# Patient Record
Sex: Male | Born: 1950 | Race: Black or African American | Hispanic: No | Marital: Married | Smoking: Never smoker
Health system: Southern US, Community
[De-identification: ages and names within clinical notes are randomized; demographics above are authoritative.]

## PROBLEM LIST (undated history)

## (undated) DIAGNOSIS — M169 Osteoarthritis of hip, unspecified: Secondary | ICD-10-CM

## (undated) DIAGNOSIS — I739 Peripheral vascular disease, unspecified: Secondary | ICD-10-CM

## (undated) DIAGNOSIS — M170 Bilateral primary osteoarthritis of knee: Secondary | ICD-10-CM

## (undated) DIAGNOSIS — I82409 Acute embolism and thrombosis of unspecified deep veins of unspecified lower extremity: Secondary | ICD-10-CM

## (undated) DIAGNOSIS — T8859XA Other complications of anesthesia, initial encounter: Secondary | ICD-10-CM

## (undated) DIAGNOSIS — I10 Essential (primary) hypertension: Secondary | ICD-10-CM

## (undated) DIAGNOSIS — E291 Testicular hypofunction: Secondary | ICD-10-CM

## (undated) DIAGNOSIS — R7303 Prediabetes: Secondary | ICD-10-CM

## (undated) HISTORY — DX: Bilateral primary osteoarthritis of knee: M17.0

## (undated) HISTORY — DX: Essential (primary) hypertension: I10

## (undated) HISTORY — DX: Osteoarthritis of hip, unspecified: M16.9

## (undated) HISTORY — PX: NECK SURGERY: SHX720

## (undated) HISTORY — PX: HEMORRHOIDECTOMY WITH HEMORRHOID BANDING: SHX5633

## (undated) HISTORY — DX: Testicular hypofunction: E29.1

## (undated) HISTORY — PX: KNEE ARTHROSCOPY: SUR90

---

## 1998-01-23 ENCOUNTER — Other Ambulatory Visit: Admission: RE | Admit: 1998-01-23 | Discharge: 1998-01-23 | Payer: Self-pay | Admitting: Internal Medicine

## 2000-01-20 ENCOUNTER — Ambulatory Visit (HOSPITAL_COMMUNITY): Admission: RE | Admit: 2000-01-20 | Discharge: 2000-01-20 | Payer: Self-pay | Admitting: Internal Medicine

## 2003-08-19 ENCOUNTER — Inpatient Hospital Stay (HOSPITAL_COMMUNITY): Admission: EM | Admit: 2003-08-19 | Discharge: 2003-08-20 | Payer: Self-pay | Admitting: Emergency Medicine

## 2003-09-03 ENCOUNTER — Ambulatory Visit (HOSPITAL_COMMUNITY): Admission: RE | Admit: 2003-09-03 | Discharge: 2003-09-03 | Payer: Self-pay | Admitting: General Surgery

## 2004-07-13 ENCOUNTER — Ambulatory Visit (HOSPITAL_COMMUNITY): Admission: RE | Admit: 2004-07-13 | Discharge: 2004-07-13 | Payer: Self-pay | Admitting: Internal Medicine

## 2004-09-27 HISTORY — PX: CARDIAC CATHETERIZATION: SHX172

## 2007-09-28 HISTORY — PX: CARPAL TUNNEL RELEASE: SHX101

## 2007-12-26 ENCOUNTER — Ambulatory Visit (HOSPITAL_COMMUNITY): Admission: RE | Admit: 2007-12-26 | Discharge: 2007-12-26 | Payer: Self-pay | Admitting: Internal Medicine

## 2008-01-26 ENCOUNTER — Ambulatory Visit (HOSPITAL_BASED_OUTPATIENT_CLINIC_OR_DEPARTMENT_OTHER): Admission: RE | Admit: 2008-01-26 | Discharge: 2008-01-26 | Payer: Self-pay | Admitting: Urology

## 2008-10-30 HISTORY — PX: CARDIAC CATHETERIZATION: SHX172

## 2011-01-15 ENCOUNTER — Other Ambulatory Visit: Payer: Self-pay | Admitting: Internal Medicine

## 2011-01-15 DIAGNOSIS — E230 Hypopituitarism: Secondary | ICD-10-CM

## 2011-01-19 ENCOUNTER — Ambulatory Visit (HOSPITAL_COMMUNITY)
Admission: RE | Admit: 2011-01-19 | Discharge: 2011-01-19 | Disposition: A | Discharge status: Home / Self Care | Payer: BC Managed Care – PPO | Source: Ambulatory Visit | Attending: Internal Medicine | Admitting: Internal Medicine

## 2011-01-19 DIAGNOSIS — J3489 Other specified disorders of nose and nasal sinuses: Secondary | ICD-10-CM | POA: Insufficient documentation

## 2011-01-19 DIAGNOSIS — M5090 Cervical disc disorder, unspecified, unspecified cervical region: Secondary | ICD-10-CM | POA: Insufficient documentation

## 2011-01-19 DIAGNOSIS — E349 Endocrine disorder, unspecified: Secondary | ICD-10-CM | POA: Insufficient documentation

## 2011-01-19 DIAGNOSIS — M5030 Other cervical disc degeneration, unspecified cervical region: Secondary | ICD-10-CM | POA: Insufficient documentation

## 2011-01-19 DIAGNOSIS — E230 Hypopituitarism: Secondary | ICD-10-CM

## 2011-01-19 MED ORDER — GADOBENATE DIMEGLUMINE 529 MG/ML IV SOLN
10.0000 mL | Freq: Once | INTRAVENOUS | Status: AC
  Administered 2011-01-19: 10 mL via INTRAVENOUS

## 2011-02-09 NOTE — Op Note (Signed)
Craig [name], Craig [name]                   ACCOUNT NO.:  [id number]   MEDICAL RECORD NO.:  [id number]          PATIENT TYPE:  AMB   LOCATION:  NESC                         FACILITY:  [facility]   PHYSICIAN:  Sigmund I. Patsi Sears, M.D.DATE OF BIRTH:  1950/11/27   DATE OF PROCEDURE:  01/26/2008  DATE OF DISCHARGE:                               OPERATIVE REPORT   PREOPERATIVE DIAGNOSES:  1. Hematuria.  2. Urethral stricture.   POSTOPERATIVE DIAGNOSES:  1. Hematuria.  2. Urethral stricture.   PROCEDURE PERFORMED:  Cystourethroscopy, balloon dilation of urethra,  Foley catheter placement.   DRAINS:  20-French council tipped catheter.   INDICATIONS FOR PROCEDURE:  Craig [name] is a 60 year old male who is know  to have urethral stricture.  He has had decreased caliber of stream.  He  also has had hematuria.  He was brought to the operating room for  further diagnostic and therapeutic maneuvers.   DESCRIPTION OF PROCEDURE:  The patient was brought to the operating  room.  He was identified by his arm band.  Informed consent was verified  and preoperative time out was performed.  After the successful induction  of general endotracheal anesthesia, the patient was moved to the dorsal  lithotomy position where all appropriate pressure points were padded to  avoid apraxia and compartment syndrome.  Perineum was prepped and  draped.  Surgeon was Designer, jewellery.  A 22-French cystoscopic sheath  was used to introduce the 12 degree cystoscopic lens, was introduced  into the urethra.  Anterior urethra was inspected.  The fossa  navicularis and pendulous urethra were normal.  Upon entering the bulbar  urethra, we saw a very small caliber approximately 4-French urethral  stricture.  Attempts to navigate a guidewire over this were  unsuccessful.  An end-hole catheter was then placed over the guidewire  to the level of the stricture.  We performed retrograde urethrography  which noted an approximately 2- to  3-cm dense stricture in the bulbar  urethra.  Contrast was seen to travel in retrograde fashion in the  bladder.  We then manipulated an angled Glidewire through the end-hole  catheter into the bladder.  The end-hole catheter was then advanced into  the bladder.  The Glidewire was removed.  The guidewire was replaced.  The Cook dilating balloon 24-French caliber was then placed over the  wire.  The wire was removed.  We then used a pressurized syringe to  inflate the balloon under fluoroscopy.  We noted a waist on the  stricture.  This waist was then resolved with pressure.  We then held  the balloon in place at 15 mmHg for a time of 5 minutes.  The balloon  was then taken down.  The wire was replaced.  The cystoscope was  reinserted into the bladder.  The wire was then removed.  We inspected  the bladder and urethra.  The stricture was well dilated.  The remainder  of the bladder was inspected and free of any mucosal lesions, erythema,  foreign bodies, diverticula, stones.  At this time, the wire was  replaced.  The cystoscope was removed.  A 20-French Council tipped  catheter was placed over the wire into the bladder.  Its placement was  confirmed with fluoroscopy.  We removed the wire.  Balloon was inflated  with 10 mL of sterile  water.  It was placed to straight drain.  The procedure was terminated.  The patient tolerated the procedure well, and there were no  complications.  Jethro Bolus was the attending primary responsible  physician who was present and participated in all aspects of the  procedure.      Melina Schools, MD      Sigmund I. Patsi Sears, M.D.  Electronically Signed    JR/MEDQ  D:  01/26/2008  T:  01/26/2008  Job:  [id number]
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
## 2011-11-20 ENCOUNTER — Ambulatory Visit: Payer: BC Managed Care – PPO

## 2011-11-20 ENCOUNTER — Ambulatory Visit (INDEPENDENT_AMBULATORY_CARE_PROVIDER_SITE_OTHER): Payer: BC Managed Care – PPO | Admitting: Family Medicine

## 2011-11-20 VITALS — BP 144/72 | HR 49 | Temp 98.0°F | Resp 20 | Ht 64.25 in | Wt 227.2 lb

## 2011-11-20 DIAGNOSIS — J069 Acute upper respiratory infection, unspecified: Secondary | ICD-10-CM

## 2011-11-20 DIAGNOSIS — R062 Wheezing: Secondary | ICD-10-CM

## 2011-11-20 DIAGNOSIS — J988 Other specified respiratory disorders: Secondary | ICD-10-CM

## 2011-11-20 MED ORDER — AZITHROMYCIN 250 MG PO TABS: ORAL_TABLET | ORAL | Status: AC

## 2011-11-20 MED ORDER — IPRATROPIUM-ALBUTEROL 18-103 MCG/ACT IN AERO: 2.0000 | INHALATION_SPRAY | Freq: Four times a day (QID) | RESPIRATORY_TRACT | Status: DC | PRN

## 2011-11-20 NOTE — Progress Notes (Signed)
[facility]:  Office Visit  Chief Complaint:  Chief Complaint  Patient presents with  . Cough    x 1 week chest congestion  taking mucinex this am  & [facility] cough/congestion 2 caps q4h  . Ear Fullness    x 2 days    HPI: Craig [name] is a 61 y.o. male who complains of 1 wek h/o cough, congestion, ear pressure. Tried OTC meds without relief. Denies fevers, chill. + HA, sinus congestion. + wheeze  Past Medical History  Diagnosis Date  . Hypertension   . Hypogonadism male    Past Surgical History  Procedure Date  . Knee arthroscopy   . Cardiac catheterization 2006    no blockage   History   Social History  . Marital Status: Married    Spouse Name: N/A    Number of Children: N/A  . Years of Education: N/A   Social History Main Topics  . Smoking status: Never Smoker   . Smokeless tobacco: None  . Alcohol Use: No  . Drug Use: No  . Sexually Active: None   Other Topics Concern  . None   Social History Narrative  . None   Family History  Problem Relation Age of Onset  . Diabetes Mother   . Cancer Mother   . Diabetes Father    Allergies  Allergen Reactions  . Penicillins Rash   Prior to Admission medications   Medication Sig Start Date End Date Taking? Authorizing Provider  aspirin 81 MG tablet Take 81 mg by mouth daily.   Yes Historical Provider, MD  Multiple Vitamins-Minerals (MULTIVITAMIN PO) Take by mouth.   Yes Historical Provider, MD  nebivolol (BYSTOLIC) 5 MG tablet Take 5 mg by mouth every other day.   Yes Historical Provider, MD  Testosterone (ANDROGEL) 40.5 MG/2.5GM (1.62%) GEL Place onto the skin daily.   Yes Historical Provider, MD  celecoxib (CELEBREX) 200 MG capsule Take 200 mg by mouth daily.    Historical Provider, MD     ROS: The patient denies fevers, chills, night sweats, unintentional weight loss, chest pain, palpitations, dyspnea on exertion, nausea, vomiting, abdominal pain, dysuria, hematuria, melena, numbness, weakness,  or tingling. + wheeze all the time  All other systems have been reviewed and were otherwise negative with the exception of those mentioned in the HPI and as above.    PHYSICAL EXAM: Filed Vitals:   11/20/11 1126  BP: 144/72  Pulse: 49  Temp: 98 F (36.7 C)  Resp: 20   Filed Vitals:   11/20/11 1126  Height: 5' 4.25" (1.632 m)  Weight: 227 lb 3.2 oz (103.057 kg)   Body mass index is 38.70 kg/(m^2).  General: Alert, no acute distress HEENT:  Normocephalic, atraumatic, oropharynx patent. Ext and TM normal bilaterally, boggy nares, + sinus tenderness Cardiovascular:  Regular rate and rhythm, no rubs murmurs or gallops.  No Carotid bruits, radial pulse intact. No pedal edema.  Respiratory: Clear to auscultation bilaterally, rales, or rhonchi.  No cyanosis, no use of accessory musculature. + wheeze GI: No organomegaly, abdomen is soft and non-tender, positive bowel sounds.  No masses. Skin: No rashes. Neurologic: Facial musculature symmetric. Psychiatric: Patient is appropriate throughout our interaction. Lymphatic: No cervical lymphadenopathy Musculoskeletal: Gait intact.   EKG/XRAY:   Primary read interpreted by Dr. Conley Rolls at [facility]. ? Right mid lobe infiltrate vs vascular congestion   ASSESSMENT/PLAN: Encounter Diagnoses  Name Primary?  . Wheeze Yes  . Respiratory infection  1. Z-pack for sinusitis vs ? PNA 2. Combivent for wheezing.    Jaley Yan PHUONG, DO 11/20/2011 1:28 PM

## 2013-01-22 ENCOUNTER — Other Ambulatory Visit (HOSPITAL_COMMUNITY): Payer: Self-pay | Admitting: Internal Medicine

## 2013-01-22 DIAGNOSIS — M25561 Pain in right knee: Secondary | ICD-10-CM

## 2013-01-22 DIAGNOSIS — M7989 Other specified soft tissue disorders: Secondary | ICD-10-CM

## 2013-01-23 ENCOUNTER — Encounter (HOSPITAL_COMMUNITY): Payer: Self-pay | Admitting: *Deleted

## 2013-01-23 ENCOUNTER — Emergency Department (HOSPITAL_COMMUNITY): Payer: BC Managed Care – PPO

## 2013-01-23 ENCOUNTER — Inpatient Hospital Stay (HOSPITAL_COMMUNITY)
Admission: EM | Admit: 2013-01-23 | Discharge: 2013-01-24 | DRG: 541 | Disposition: A | Discharge status: Home / Self Care | Payer: BC Managed Care – PPO | Attending: Internal Medicine | Admitting: Internal Medicine

## 2013-01-23 ENCOUNTER — Ambulatory Visit (HOSPITAL_COMMUNITY)
Admission: RE | Admit: 2013-01-23 | Discharge: 2013-01-23 | Disposition: A | Discharge status: Home / Self Care | Payer: BC Managed Care – PPO | Source: Ambulatory Visit | Attending: Internal Medicine | Admitting: Internal Medicine

## 2013-01-23 DIAGNOSIS — M7989 Other specified soft tissue disorders: Secondary | ICD-10-CM

## 2013-01-23 DIAGNOSIS — I82409 Acute embolism and thrombosis of unspecified deep veins of unspecified lower extremity: Secondary | ICD-10-CM | POA: Diagnosis present

## 2013-01-23 DIAGNOSIS — Z86711 Personal history of pulmonary embolism: Secondary | ICD-10-CM | POA: Diagnosis present

## 2013-01-23 DIAGNOSIS — M25561 Pain in right knee: Secondary | ICD-10-CM

## 2013-01-23 DIAGNOSIS — I824Z9 Acute embolism and thrombosis of unspecified deep veins of unspecified distal lower extremity: Secondary | ICD-10-CM | POA: Diagnosis present

## 2013-01-23 DIAGNOSIS — E291 Testicular hypofunction: Secondary | ICD-10-CM | POA: Diagnosis present

## 2013-01-23 DIAGNOSIS — I10 Essential (primary) hypertension: Secondary | ICD-10-CM

## 2013-01-23 DIAGNOSIS — Z7982 Long term (current) use of aspirin: Secondary | ICD-10-CM

## 2013-01-23 DIAGNOSIS — I82401 Acute embolism and thrombosis of unspecified deep veins of right lower extremity: Secondary | ICD-10-CM

## 2013-01-23 DIAGNOSIS — I2699 Other pulmonary embolism without acute cor pulmonale: Principal | ICD-10-CM | POA: Diagnosis present

## 2013-01-23 DIAGNOSIS — I517 Cardiomegaly: Secondary | ICD-10-CM | POA: Diagnosis present

## 2013-01-23 DIAGNOSIS — Z79899 Other long term (current) drug therapy: Secondary | ICD-10-CM

## 2013-01-23 LAB — CBC WITH DIFFERENTIAL/PLATELET
Basophils Absolute: 0.1 10*3/uL (ref 0.0–0.1)
Basophils Relative: 1 % (ref 0–1)
Eosinophils Absolute: 0.3 10*3/uL (ref 0.0–0.7)
Eosinophils Relative: 5 % (ref 0–5)
HCT: 42.6 % (ref 39.0–52.0)
Hemoglobin: 14.9 g/dL (ref 13.0–17.0)
Lymphocytes Relative: 22 % (ref 12–46)
Lymphs Abs: 1.2 10*3/uL (ref 0.7–4.0)
MCH: 29.6 pg (ref 26.0–34.0)
MCHC: 35 g/dL (ref 30.0–36.0)
MCV: 84.7 fL (ref 78.0–100.0)
Monocytes Absolute: 0.4 10*3/uL (ref 0.1–1.0)
Monocytes Relative: 7 % (ref 3–12)
Neutro Abs: 3.7 10*3/uL (ref 1.7–7.7)
Neutrophils Relative %: 65 % (ref 43–77)
Platelets: 138 10*3/uL — ABNORMAL LOW (ref 150–400)
RBC: 5.03 MIL/uL (ref 4.22–5.81)
RDW: 12.6 % (ref 11.5–15.5)
WBC: 5.7 10*3/uL (ref 4.0–10.5)

## 2013-01-23 LAB — BASIC METABOLIC PANEL
BUN: 11 mg/dL (ref 6–23)
CO2: 28 mEq/L (ref 19–32)
Calcium: 9.5 mg/dL (ref 8.4–10.5)
Chloride: 103 mEq/L (ref 96–112)
Creatinine, Ser: 1.04 mg/dL (ref 0.50–1.35)
GFR calc Af Amer: 86 mL/min — ABNORMAL LOW (ref >90)
GFR calc non Af Amer: 74 mL/min — ABNORMAL LOW (ref >90)
Glucose, Bld: 106 mg/dL — ABNORMAL HIGH (ref 70–99)
Potassium: 4.4 mEq/L (ref 3.5–5.1)
Sodium: 138 mEq/L (ref 135–145)

## 2013-01-23 LAB — PROTIME-INR
INR: 1.04 (ref 0.00–1.49)
Prothrombin Time: 13.5 seconds (ref 11.6–15.2)

## 2013-01-23 MED ORDER — HYDROMORPHONE HCL PF 1 MG/ML IJ SOLN: 1.0000 mg | INTRAMUSCULAR | Status: DC | PRN

## 2013-01-23 MED ORDER — RIVAROXABAN 15 MG PO TABS
15.0000 mg | ORAL_TABLET | Freq: Two times a day (BID) | ORAL | Status: DC
Start: 2013-01-23 — End: 2013-01-23
  Filled 2013-01-23: qty 1

## 2013-01-23 MED ORDER — ENOXAPARIN SODIUM 100 MG/ML ~~LOC~~ SOLN
100.0000 mg | SUBCUTANEOUS | Status: AC
  Administered 2013-01-23: 100 mg via SUBCUTANEOUS
  Filled 2013-01-23: qty 1

## 2013-01-23 MED ORDER — IOHEXOL 350 MG/ML SOLN
100.0000 mL | Freq: Once | INTRAVENOUS | Status: AC | PRN
  Administered 2013-01-23: 100 mL via INTRAVENOUS

## 2013-01-23 MED ORDER — ONDANSETRON HCL 4 MG/2ML IJ SOLN: 4.0000 mg | Freq: Three times a day (TID) | INTRAMUSCULAR | Status: AC | PRN

## 2013-01-23 MED ORDER — ENOXAPARIN SODIUM 100 MG/ML ~~LOC~~ SOLN
100.0000 mg | Freq: Two times a day (BID) | SUBCUTANEOUS | Status: DC
  Filled 2013-01-23: qty 1

## 2013-01-23 MED ORDER — ACETAMINOPHEN 650 MG RE SUPP: 650.0000 mg | Freq: Four times a day (QID) | RECTAL | Status: DC | PRN

## 2013-01-23 MED ORDER — ONDANSETRON HCL 4 MG PO TABS: 4.0000 mg | ORAL_TABLET | Freq: Four times a day (QID) | ORAL | Status: DC | PRN

## 2013-01-23 MED ORDER — ACETAMINOPHEN 325 MG PO TABS: 650.0000 mg | ORAL_TABLET | Freq: Four times a day (QID) | ORAL | Status: DC | PRN

## 2013-01-23 MED ORDER — SODIUM CHLORIDE 0.9 % IJ SOLN
3.0000 mL | Freq: Two times a day (BID) | INTRAMUSCULAR | Status: DC
  Administered 2013-01-23 – 2013-01-24 (×2): 3 mL via INTRAVENOUS

## 2013-01-23 MED ORDER — RIVAROXABAN 20 MG PO TABS: 20.0000 mg | ORAL_TABLET | Freq: Every day | ORAL | Status: DC

## 2013-01-23 MED ORDER — ONDANSETRON HCL 4 MG/2ML IJ SOLN: 4.0000 mg | Freq: Four times a day (QID) | INTRAMUSCULAR | Status: DC | PRN

## 2013-01-23 MED ORDER — RIVAROXABAN 15 MG PO TABS
15.0000 mg | ORAL_TABLET | Freq: Two times a day (BID) | ORAL | Status: DC
  Administered 2013-01-23 – 2013-01-24 (×2): 15 mg via ORAL
  Filled 2013-01-23 (×4): qty 1

## 2013-01-23 NOTE — Progress Notes (Signed)
ANTICOAGULATION CONSULT NOTE - Initial Consult  Pharmacy Consult for Lovenox Indication: DVT  Allergies  Allergen Reactions  . Penicillins Rash    Patient Measurements:   Stated : Wt ~220 lb (100kg); Ht 64 in  Vital Signs: Temp: 98.4 F (36.9 C) (04/29 0942) Temp src: Oral (04/29 0942) BP: 127/76 mmHg (04/29 0942) Pulse Rate: 54 (04/29 0942)  Labs:  Recent Labs  01/23/13 1055  HGB 14.9  HCT 42.6  PLT 138*  LABPROT 13.5  INR 1.04  CREATININE 1.04    The CrCl is unknown because both a height and weight (above a minimum accepted value) are required for this calculation.   Medical History: Past Medical History  Diagnosis Date  . Hypertension   . Hypogonadism male     Medications:  ASA 81, Celebrex, Clomid, MVI, Nevibolol  Assessment: 62 y.o. male presents with leg pain. Ultrasound in ED shows DVT in RLE. To begin lovenox. CBC okay at baseline except plt slightly low at 138.  Goal of Therapy:  Anti-Xa level 0.6-1.2 units/ml 4hrs after LMWH dose given Monitor platelets by anticoagulation protocol: Yes   Plan:  1. Lovenox 100mg  SQ q12h. First dose now. 2. Can consider Lovenox 150mg  SQ q24h if desire to send pt home 3. Also consider start of coumadin or one of newer anticoagulants (ex. rivaroxaban). Newer anticoagulants do not require Lovenox bridge.  Christoper Fabian, PharmD, BCPS Clinical pharmacist, pager [phone] 01/23/2013,12:17 PM

## 2013-01-23 NOTE — H&P (Signed)
[facility] History and Physical  SANEL STEMMER ZOX:[id number] DOB: 1950-10-19 DOA: 01/23/2013  Referring physician: ED PCP: Laurena Slimmer, MD    Chief Complaint:  Chief Complaint  Patient presents with  . Leg Pain     HPI: Craig [name] is a 62 y.o. male who presented with complaint of right leg swelling and pain for the past week. 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 He also takes clomiphene. No personal or family history of DVT/PE. No shortness of breath at rest or chest pain however the patient does report decreased exercise tolerance with SOB over the past week while playing tennis. Venous doppler confirms an extensive DVT and bilat pulmonar emboli. Denies chest pain and hemoptysis.   Review of Systems: The patient denies anorexia, fever, weight loss,, vision loss, decreased hearing, hoarseness, chest pain, syncope,  balance deficits, hemoptysis, abdominal pain, melena, hematochezia, severe indigestion/heartburn, hematuria, incontinence, genital sores, muscle weakness, suspicious skin lesions, transient blindness, difficulty walking, depression, unusual weight change, abnormal bleeding, enlarged lymph nodes, angioedema, and breast masses.    Past Medical History  Diagnosis Date  . Hypertension   . Hypogonadism male    Past Surgical History  Procedure Laterality Date  . Knee arthroscopy    . Cardiac catheterization  2006    no blockage   Social History:  reports that he has never smoked. He does not have any smokeless tobacco history on file. He reports that he does not drink alcohol or use illicit drugs.  Allergies  Allergen Reactions  . Penicillins Rash    Family History  Problem Relation Age of Onset  . Diabetes Mother   . Cancer Mother   . Diabetes Father      Prior to Admission medications   Medication Sig Start Date End Date Taking? Authorizing Provider  aspirin 81 MG tablet Take 81 mg  by mouth daily.   Yes Historical Provider, MD  celecoxib (CELEBREX) 200 MG capsule Take 200 mg by mouth daily.   Yes Historical Provider, MD  clomiPHENE (CLOMID) 50 MG tablet Take 50 mg by mouth daily.   Yes Historical Provider, MD  Multiple Vitamins-Minerals (MULTIVITAMIN PO) Take by mouth.   Yes Historical Provider, MD  nebivolol (BYSTOLIC) 5 MG tablet Take 5 mg by mouth every other day.   Yes Historical Provider, MD   Physical Exam: Filed Vitals:   01/23/13 1408 01/23/13 1413 01/23/13 1500 01/23/13 1611  BP: 126/68 126/68 135/77 134/75  Pulse: 51 53 60 56  Temp:  99.8 F (37.7 C)  98.6 F (37 C)  TempSrc:  Oral  Oral  Resp: 16 11 22 22   Height:    5\' 4"  (1.626 m)  Weight:    100.381 kg (221 lb 4.8 oz)  SpO2: 97% 97% 96% 97%     General:  axox3  Eyes: perrla, eomi   ENT: clear pharynx   Neck: no JVD  Cardiovascular: ctab   Respiratory: rrr  Abdomen: soft, nt   Skin: warm dry   Musculoskeletal: intact   Psychiatric: euthymic   Neurologic: non focal   Labs on Admission:  Basic Metabolic Panel:  Recent Labs Lab 01/23/13 1055  NA 138  K 4.4  CL 103  CO2 28  GLUCOSE 106*  BUN 11  CREATININE 1.04  CALCIUM 9.5   Liver Function Tests: No results found for this basename: AST, ALT, ALKPHOS, BILITOT, PROT, ALBUMIN,  in the last 168  hours No results found for this basename: LIPASE, AMYLASE,  in the last 168 hours No results found for this basename: AMMONIA,  in the last 168 hours CBC:  Recent Labs Lab 01/23/13 1055  WBC 5.7  NEUTROABS 3.7  HGB 14.9  HCT 42.6  MCV 84.7  PLT 138*   Cardiac Enzymes: No results found for this basename: CKTOTAL, CKMB, CKMBINDEX, TROPONINI,  in the last 168 hours  BNP (last 3 results) No results found for this basename: PROBNP,  in the last 8760 hours CBG: No results found for this basename: GLUCAP,  in the last 168 hours  Radiological Exams on Admission: Ct Angio Chest W/cm &/or Wo Cm  01/23/2013  *RADIOLOGY  REPORT*  Clinical Data: Leg pain, DVT.  CT ANGIOGRAPHY CHEST  Technique:  Multidetector CT imaging of the chest using the standard protocol during bolus administration of intravenous contrast. Multiplanar reconstructed images including MIPs were obtained and reviewed to evaluate the vascular anatomy.  Contrast: OMNIPAQUE IOHEXOL 350 MG/ML SOLN  Comparison: None.  Findings: There are bilateral pulmonary emboli involving all lobes of both lungs.  The largest extends into the left lower pole pulmonary arteries.  Moderate sized bilateral upper lobe pulmonary emboli and right lower lobe.  Small emboli in the right middle lobe.  Mild cardiomegaly.  Aorta is normal caliber. No mediastinal, hilar, or axillary adenopathy.  Visualized thyroid and chest wall soft tissues unremarkable.  Peripheral ground-glass airspace opacity noted in the right lung base laterally.  This could reflect early pulmonary infarct.  No pleural effusions.  No focal opacity on the left.  Imaging into the upper abdomen shows no acute findings.  IMPRESSION: Multiple bilateral small to moderate-sized pulmonary emboli, the largest in the lower lobes bilaterally, left greater than right.  Peripheral ground-glass density at the right lung base may reflect early infarct.  Cardiomegaly.  These results were called to Renne Crigler at the time of interpretation.   Original Report Authenticated By: Charlett Nose, M.D.       Assessment/Plan Principal Problem:   Pulmonary embolism Active Problems:   Hypertension   DVT (deep venous thrombosis)   1. PE and DVT - plan for anticoagulation with Xarelto. Monitor in hospital for a couple of days for lung infarct and to make sure there is no hemoptysis 2. HTN prior to admission looks controlled now.    Code Status: full  Family Communication: patient  Disposition Plan: home   Kenlyn Lose [facility] Pager [phone]  If 7PM-7AM, please contact night-coverage [email or web address] Password  [facility] 01/23/2013, 4:34 PM

## 2013-01-23 NOTE — Care Management Note (Signed)
    Page 1 of 1   01/24/2013     2:13:38 PM   CARE MANAGEMENT NOTE 01/24/2013  Patient:  Craig [name], Craig [name]   Account Number:  [id number]  Date Initiated:  01/23/2013  Documentation initiated by:  Letha Cape  Subjective/Objective Assessment:   dx pe  admit- lives with spouse.     Action/Plan:   Anticipated DC Date:  01/25/2013   Anticipated DC Plan:  HOME/SELF CARE      DC Planning Services  CM consult      Choice offered to / List presented to:             Status of service:  Completed, signed off Medicare Important Message given?   (If response is "NO", the following Medicare IM given date fields will be blank) Date Medicare IM given:   Date Additional Medicare IM given:    Discharge Disposition:  HOME/SELF CARE  Per UR Regulation:  Reviewed for med. necessity/level of care/duration of stay  If discussed at Long Length of Stay Meetings, dates discussed:    Comments:  01/23/13 16:50 Letha Cape RN,BSN [phone] patient lives with spouse, pta indep.  NCM will continue to follow for dc needs.  Gave patient the xarelto card with co pay for $10, informed him if he has any problems to call me.

## 2013-01-23 NOTE — Progress Notes (Signed)
Bilateral lower extremity venous duplex completed.  Right:  DVT noted in the common femoral, femoral, profunda, popliteal, posterior tibial, and peroneal veins.  No evidence of superficial thrombosis.  No Baker's cyst.  Left:  No evidence of DVT, superficial thrombosis, or Baker's cyst.

## 2013-01-23 NOTE — ED Provider Notes (Signed)
History     CSN: [id number]  Arrival date & time 01/23/13  9147   First MD Initiated Contact with Patient 01/23/13 1015      Chief Complaint  Patient presents with  . Leg Pain    (Consider location/radiation/quality/duration/timing/severity/associated sxs/prior treatment) HPI Comments: Patient presents with complaint of right leg swelling and pain for the past week. 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 He also takes testosterone. Only blood thinner is aspirin. No history of DVT/PE. No shortness of breath at rest or chest pain however the patient does report decreased exercise tolerance with SOB over the past week while playing tennis. Denies other medical complaints. Onset of symptoms gradual. Course is gradually worsening. Nothing makes symptoms better. Walking makes pain worse.  The history is provided by the patient.    Past Medical History  Diagnosis Date  . Hypertension   . Hypogonadism male     Past Surgical History  Procedure Laterality Date  . Knee arthroscopy    . Cardiac catheterization  2006    no blockage    Family History  Problem Relation Age of Onset  . Diabetes Mother   . Cancer Mother   . Diabetes Father     History  Substance Use Topics  . Smoking status: Never Smoker   . Smokeless tobacco: Not on file  . Alcohol Use: No      Review of Systems  Constitutional: Positive for fatigue (with activity). Negative for fever.  HENT: Negative for sore throat and rhinorrhea.   Eyes: Negative for redness.  Respiratory: Positive for shortness of breath (with activity). Negative for cough.   Cardiovascular: Positive for leg swelling. Negative for chest pain.  Gastrointestinal: Negative for nausea, vomiting, abdominal pain and diarrhea.  Endocrine: Positive for polyphagia.  Genitourinary: Negative for dysuria.  Musculoskeletal: Negative for myalgias.  Skin: Negative for rash.   Neurological: Negative for headaches.    Allergies  Penicillins  Home Medications   Current Outpatient Rx  Name  Route  Sig  Dispense  Refill  . aspirin 81 MG tablet   Oral   Take 81 mg by mouth daily.         . celecoxib (CELEBREX) 200 MG capsule   Oral   Take 200 mg by mouth daily.         . clomiPHENE (CLOMID) 50 MG tablet   Oral   Take 50 mg by mouth daily.         . Multiple Vitamins-Minerals (MULTIVITAMIN PO)   Oral   Take by mouth.         . nebivolol (BYSTOLIC) 5 MG tablet   Oral   Take 5 mg by mouth every other day.           BP 127/76  Pulse 54  Temp(Src) 98.4 F (36.9 C) (Oral)  Resp 18  SpO2 94%  Physical Exam  Nursing note and vitals reviewed. Constitutional: He appears well-developed and well-nourished.  HENT:  Head: Normocephalic and atraumatic.  Eyes: Conjunctivae are normal. Right eye exhibits no discharge. Left eye exhibits no discharge.  Neck: Normal range of motion. Neck supple.  Cardiovascular: Normal rate, regular rhythm and normal heart sounds.   Pulses:      Dorsalis pedis pulses are 2+ on the right side, and 2+ on the left side.       Posterior tibial pulses are 2+ on the right side, and 2+  on the left side.  Pulmonary/Chest: Effort normal and breath sounds normal.  Abdominal: Soft. There is no tenderness.  Musculoskeletal: He exhibits edema and tenderness.  2+ pitting edema of right lower extremity to knee. Mild erythema. Findings suspicious for DVT.  Neurological: He is alert.  Skin: Skin is warm and dry.  Psychiatric: He has a normal mood and affect.    ED Course  Procedures (including critical care time)  Labs Reviewed  CBC WITH DIFFERENTIAL - Abnormal; Notable for the following:    Platelets 138 (*)    All other components within normal limits  BASIC METABOLIC PANEL - Abnormal; Notable for the following:    Glucose, Bld 106 (*)    GFR calc non Af Amer 74 (*)    GFR calc Af Amer 86 (*)    All other  components within normal limits  PROTIME-INR   Ct Angio Chest W/cm &/or Wo Cm  01/23/2013  *RADIOLOGY REPORT*  Clinical Data: Leg pain, DVT.  CT ANGIOGRAPHY CHEST  Technique:  Multidetector CT imaging of the chest using the standard protocol during bolus administration of intravenous contrast. Multiplanar reconstructed images including MIPs were obtained and reviewed to evaluate the vascular anatomy.  Contrast: OMNIPAQUE IOHEXOL 350 MG/ML SOLN  Comparison: None.  Findings: There are bilateral pulmonary emboli involving all lobes of both lungs.  The largest extends into the left lower pole pulmonary arteries.  Moderate sized bilateral upper lobe pulmonary emboli and right lower lobe.  Small emboli in the right middle lobe.  Mild cardiomegaly.  Aorta is normal caliber. No mediastinal, hilar, or axillary adenopathy.  Visualized thyroid and chest wall soft tissues unremarkable.  Peripheral ground-glass airspace opacity noted in the right lung base laterally.  This could reflect early pulmonary infarct.  No pleural effusions.  No focal opacity on the left.  Imaging into the upper abdomen shows no acute findings.  IMPRESSION: Multiple bilateral small to moderate-sized pulmonary emboli, the largest in the lower lobes bilaterally, left greater than right.  Peripheral ground-glass density at the right lung base may reflect early infarct.  Cardiomegaly.  These results were called to Renne Crigler at the time of interpretation.   Original Report Authenticated By: Charlett Nose, M.D.      1. Pulmonary embolism   2. DVT (deep venous thrombosis), right     10:38 AM Patient seen and examined. Work-up initiated. +DVT. Will need CT given sx.   Vital signs reviewed and are as follows: Filed Vitals:   01/23/13 0942  BP: 127/76  Pulse: 54  Temp: 98.4 F (36.9 C)  Resp: 18   1:44 PM CT reviewed and interpreted by myself. Lovenox already given. D/w Dr. Denton Lank.   Pt admitted to hospitalist. He is stable.     MDM  PE/DVT -- admit for treatment.        Renne Crigler, PA-C 01/23/13 1546

## 2013-01-23 NOTE — ED Notes (Signed)
To ED for eval of left leg for the past cple weeks. Pt has swelling in leg and told to come to ED. No SOB. No CP. Pt is taking low dose ASA and Celebrex.

## 2013-01-23 NOTE — Progress Notes (Signed)
Craig [name] [id number] Code Status: full   Admission Data: 01/23/2013 5:35 PM Attending Provider:  laza WUJ:WJXBJ,YNWGNFA S, MD Consults/ Treatment Team:    Craig [name] is a 62 y.o. male patient admitted from ED awake, alert - oriented  X 3 - no acute distress noted.  VSS - Blood pressure 134/75, pulse 56, temperature 98.6 F (37 C), temperature source Oral, resp. rate 22, height 5\' 4"  (1.626 m), weight 100.381 kg (221 lb 4.8 oz), SpO2 97.00%.  no c/o shortness of breath, no c/o chest pain. Cardiac tele # [id number], in place, cardiac monitor yields:normal sinus rhythm. IV Fluids:  IV in place, occlusive dsg intact without redness, IV cath forearm left, condition patent and no redness none.  Allergies:   Allergies  Allergen Reactions  . Penicillins Rash     Past Medical History  Diagnosis Date  . Hypertension   . Hypogonadism male    Medications Prior to Admission  Medication Sig Dispense Refill  . aspirin 81 MG tablet Take 81 mg by mouth daily.      . celecoxib (CELEBREX) 200 MG capsule Take 200 mg by mouth daily.      . clomiPHENE (CLOMID) 50 MG tablet Take 50 mg by mouth daily.      . Multiple Vitamins-Minerals (MULTIVITAMIN PO) Take by mouth.      . nebivolol (BYSTOLIC) 5 MG tablet Take 5 mg by mouth every other day.       History:  obtained from the patient. Tobacco/alcohol: denied none  Orientation to room, and floor completed with information packet given to patient/family.  Patient declined safety video at this time.  Admission INP armband ID verified with patient/family, and in place.   SR up x 2, fall assessment complete, with patient and family able to verbalize understanding of risk associated with falls, and verbalized understanding to call nsg before up out of bed.  Call light within reach, patient able to voice, and demonstrate understanding.  Skin, clean-dry- intact without evidence of bruising, or skin tears.   No evidence of skin break down noted on exam.     Will  cont to eval and treat per MD orders.  Orvan Seen, RN 01/23/2013 5:35 PM

## 2013-01-24 DIAGNOSIS — I2699 Other pulmonary embolism without acute cor pulmonale: Secondary | ICD-10-CM

## 2013-01-24 LAB — CBC
MCV: 85.1 fL (ref 78.0–100.0)
Platelets: 144 10*3/uL — ABNORMAL LOW (ref 150–400)
RDW: 12.5 % (ref 11.5–15.5)
WBC: 5.5 10*3/uL (ref 4.0–10.5)

## 2013-01-24 LAB — BASIC METABOLIC PANEL
Calcium: 9.2 mg/dL (ref 8.4–10.5)
Creatinine, Ser: 1.03 mg/dL (ref 0.50–1.35)
GFR calc Af Amer: 87 mL/min — ABNORMAL LOW (ref >90)
Sodium: 139 mEq/L (ref 135–145)

## 2013-01-24 MED ORDER — RIVAROXABAN 20 MG PO TABS: 20.0000 mg | ORAL_TABLET | Freq: Every day | ORAL | Status: DC

## 2013-01-24 MED ORDER — RIVAROXABAN 15 MG PO TABS: 15.0000 mg | ORAL_TABLET | Freq: Two times a day (BID) | ORAL | Status: DC

## 2013-01-24 NOTE — Progress Notes (Signed)
Craig [name] to be D/C'd Home per MD order.  Discussed with the patient and all questions fully answered.    Medication List    STOP taking these medications       celecoxib 200 MG capsule  Commonly known as:  CELEBREX     clomiPHENE 50 MG tablet  Commonly known as:  CLOMID      TAKE these medications       aspirin 81 MG tablet  Take 81 mg by mouth daily.     MULTIVITAMIN PO  Take by mouth.     nebivolol 5 MG tablet  Commonly known as:  BYSTOLIC  Take 5 mg by mouth every other day.     Rivaroxaban 15 MG Tabs tablet  Commonly known as:  XARELTO  Take 1 tablet (15 mg total) by mouth 2 (two) times daily before a meal.     Rivaroxaban 20 MG Tabs  Commonly known as:  XARELTO  Take 1 tablet (20 mg total) by mouth daily with supper. Start this after 21 days   The 1st 21 days is 15 mg twice a day.  Start taking on:  02/13/2013        VVS, Skin clean, dry and intact without evidence of skin break down, no evidence of skin tears noted. IV catheter discontinued intact. Site without signs and symptoms of complications. Dressing and pressure applied.  An After Visit Summary was printed and given to the patient. Patient escorted via WC, and D/C home via private auto.  Kennyth Arnold D 01/24/2013 12:02 PM

## 2013-01-24 NOTE — ED Provider Notes (Signed)
Medical screening examination/treatment/procedure(s) were conducted as a shared visit with non-physician practitioner(s) and myself.  I personally evaluated the patient during the encounter Pt with bil leg swelling, vascular dopplers w dvt, now w sob. pe on ct.  Admit.   Suzi Roots, MD 01/24/13 [phone]

## 2013-01-24 NOTE — Discharge Summary (Signed)
Physician Discharge Summary  Craig [name] RUE:[id number] DOB: 07-Mar-1951 DOA: 01/23/2013  PCP: Laurena Slimmer, MD  Admit date: 01/23/2013 Discharge date: 01/24/2013  Time spent: 35 minutes  Recommendations for Outpatient Follow-up:  1. 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 Multiple bilateral small to moderate-sized pulmonary emboli, the largest in the lower lobes bilaterally, left greater than right. Peripheral ground-glass density at the right lung base may reflect early infarct. Cardiomegaly. With extensive right lower extremity DVT   Hospital Course:  Extensive Right DVT with Bilateral PE -Etiology likely a combination of Clomidiphene (hormone) therapy and a being a long haul truck driver. -Venous doppler shows right lower extremity JYN:WGNFA deep vein thrombosis involving the right common femoral, femoral, profunda, popliteal, posterior tibial, and peroneal veins. -CT angio shows bilateral PE -Monitored in the hospital overnight to ensure stability and monitor for signs of right heart failure. -Started on anticoagulation with Xarelto x 6 months  -Ambulatory Pulse ox test to verify oxygen saturation stability for discharge -Follow up with Dr. Margaretmary Bayley for monitoring and final determination regarding treatment for low testosterone.  HTN -Appears well controlled on bystolic   Discharge Exam: Filed Vitals:   01/23/13 1611 01/23/13 2150 01/24/13 0533 01/24/13 0930  BP: 134/75 107/62 135/83 113/73  Pulse: 56 54 50 63  Temp: 98.6 F (37  C) 98.7 F (37.1 C) 98.2 F (36.8 C) 97.9 F (36.6 C)  TempSrc: Oral Oral Oral Oral  Resp: 22 22 20 22   Height: 5\' 4"  (1.626 m)     Weight: 100.381 kg (221 lb 4.8 oz)     SpO2: 97% 97% 98% 95%    General: WDWN in no acute distress. Appears energetic and non-toxic Cardiovascular: RRR, no MGR  Respiratory: CTA B  Abdomen: soft NT, ND, Bs+  Musculoskeletal: full range of motion and strength in all for extremities. Right lower extremity has 1-2+ pitting edema and is slightly warm. Positive homans sign.   Discharge Instructions  Discharge Orders   Future Orders Complete By Expires     Diet - low sodium heart healthy  As directed     Increase activity slowly  As directed         Medication List    STOP taking these medications       celecoxib 200 MG capsule  Commonly known as:  CELEBREX     clomiPHENE 50 MG tablet  Commonly known as:  CLOMID      TAKE these medications       aspirin 81 MG tablet  Take 81 mg by mouth daily.     MULTIVITAMIN PO  Take by mouth.     nebivolol 5 MG tablet  Commonly known as:  BYSTOLIC  Take 5 mg by mouth every other day.     Rivaroxaban 15 MG Tabs tablet  Commonly known as:  XARELTO  Take 1 tablet (15 mg total) by mouth 2 (two) times daily before a meal.     Rivaroxaban 20 MG Tabs  Start taking on:  02/13/2013  Commonly known  as:  XARELTO  Take 1 tablet (20 mg total) by mouth daily with supper. Start this after 21 days   The 1st 21 days is 15 mg twice a day.             Follow-up Information   Follow up with Laurena Slimmer, MD. Schedule an appointment as soon as possible for a visit in 1 week.   Contact information:   [address] [place] [address] [phone]        The results of significant diagnostics from this hospitalization (including imaging, microbiology, ancillary and laboratory) are listed below for reference.    Significant Diagnostic  Studies:  Venous Doppler Ultrasound of the lower extremities Venous flow:  +-----------------------+----------+----------------------+ Location Overall Flow properties  +-----------------------+----------+----------------------+ Right common femoral ThrombosedNon phasic;    spontaneous;    noncompressible  +-----------------------+----------+----------------------+ Right femoral ThrombosedNot spontaneous;    noncompressible  +-----------------------+----------+----------------------+ Right profunda femoral ThrombosedNot spontaneous;    noncompressible  +-----------------------+----------+----------------------+ Right popliteal ThrombosedNot spontaneous;    noncompressible  +-----------------------+----------+----------------------+ Right posterior tibial ThrombosedNot spontaneous;    noncompressible  +-----------------------+----------+----------------------+ Right peroneal ThrombosedNot spontaneous;    noncompressible  +-----------------------+----------+----------------------+ Right saphenofemoral Patent Compressible  junction    +-----------------------+----------+----------------------+ Right greater saphenousPatent Compressible  +-----------------------+----------+----------------------+ Left common femoral Patent Phasic; spontaneous;    compressible  +-----------------------+----------+----------------------+ Left femoral Patent Compressible  +-----------------------+----------+----------------------+ Left profunda femoral Patent Compressible  +-----------------------+----------+----------------------+ Left popliteal Patent Phasic; spontaneous;    compressible  +-----------------------+----------+----------------------+ Left posterior tibial Patent Compressible  +-----------------------+----------+----------------------+ Left peroneal Patent Compressible   +-----------------------+----------+----------------------+ Left saphenofemoral Patent Compressible  junction    +-----------------------+----------+----------------------+ Left greater saphenous Patent Compressible  +-----------------------+----------+----------------------+  ------------------------------------------------------------ Summary:  - Findings consistent with acute deep vein thrombosis involving the right common femoral, femoral, profunda, popliteal, posterior tibial, and peroneal veins. - No evidence of deep vein thrombosis involving the left lower extremity.   Ct Angio Chest W/cm &/or Wo Cm  01/23/2013  *RADIOLOGY REPORT*  Clinical Data: Leg pain, DVT.  CT ANGIOGRAPHY CHEST  Technique:  Multidetector CT imaging of the chest using the standard protocol during bolus administration of intravenous contrast. Multiplanar reconstructed images including MIPs were obtained and reviewed to evaluate the vascular anatomy.  Contrast: OMNIPAQUE IOHEXOL 350 MG/ML SOLN  Comparison: None.  Findings: There are bilateral pulmonary emboli involving all lobes of both lungs.  The largest extends into the left lower pole pulmonary arteries.  Moderate sized bilateral upper lobe pulmonary emboli and right lower lobe.  Small emboli in the right middle lobe.  Mild cardiomegaly.  Aorta is normal caliber. No mediastinal, hilar, or axillary adenopathy.  Visualized thyroid and chest wall soft tissues unremarkable.  Peripheral ground-glass airspace opacity noted in the right lung base laterally.  This could reflect early pulmonary infarct.  No pleural effusions.  No focal opacity on the left.  Imaging into the upper abdomen shows no acute findings.  IMPRESSION: Multiple bilateral small to moderate-sized pulmonary emboli, the largest in the lower lobes bilaterally, left greater than right.  Peripheral ground-glass density at the right lung base may reflect early infarct.  Cardiomegaly.  These  results were called to Renne Crigler at the time of interpretation.   Original Report Authenticated By: Charlett Nose, M.D.       Labs: Basic Metabolic Panel:  Recent Labs Lab 01/23/13 1055 01/24/13 0652  NA 138 139  K 4.4 4.3  CL 103 106  CO2 28 28  GLUCOSE 106* 97  BUN 11 10  CREATININE  1.04 1.03  CALCIUM 9.5 9.2   CBC:  Recent Labs Lab 01/23/13 1055 01/24/13 0652  WBC 5.7 5.5  NEUTROABS 3.7  --   HGB 14.9 14.4  HCT 42.6 41.8  MCV 84.7 85.1  PLT 138* 144*       Signed:  Conley Canal [phone]  [facility] 01/24/2013, 11:15 AM

## 2013-01-24 NOTE — Discharge Summary (Signed)
Addendum  Patient seen and examined, chart and data base reviewed.  I agree with the above assessment and discharge plan.  For full details please see Mrs. Algis Downs PA note.  Acute PE, hemodynamically stable, D/C home on Xarelto.   Clint Lipps, MD [facility] Pager: [phone] 01/24/2013, 12:06 PM

## 2013-01-24 NOTE — Progress Notes (Signed)
Pt stated he went to [facility] for a cortisone shot in both knees a few weeks ago.

## 2013-02-06 ENCOUNTER — Emergency Department (HOSPITAL_COMMUNITY)
Admission: EM | Admit: 2013-02-06 | Discharge: 2013-02-07 | Disposition: A | Discharge status: Home / Self Care | Payer: BC Managed Care – PPO | Attending: Emergency Medicine | Admitting: Emergency Medicine

## 2013-02-06 ENCOUNTER — Encounter (HOSPITAL_COMMUNITY): Payer: Self-pay | Admitting: Emergency Medicine

## 2013-02-06 DIAGNOSIS — Z7982 Long term (current) use of aspirin: Secondary | ICD-10-CM | POA: Insufficient documentation

## 2013-02-06 DIAGNOSIS — I82401 Acute embolism and thrombosis of unspecified deep veins of right lower extremity: Secondary | ICD-10-CM

## 2013-02-06 DIAGNOSIS — I82409 Acute embolism and thrombosis of unspecified deep veins of unspecified lower extremity: Secondary | ICD-10-CM | POA: Insufficient documentation

## 2013-02-06 DIAGNOSIS — Z79899 Other long term (current) drug therapy: Secondary | ICD-10-CM | POA: Insufficient documentation

## 2013-02-06 DIAGNOSIS — Z862 Personal history of diseases of the blood and blood-forming organs and certain disorders involving the immune mechanism: Secondary | ICD-10-CM | POA: Insufficient documentation

## 2013-02-06 DIAGNOSIS — Z8639 Personal history of other endocrine, nutritional and metabolic disease: Secondary | ICD-10-CM | POA: Insufficient documentation

## 2013-02-06 DIAGNOSIS — I10 Essential (primary) hypertension: Secondary | ICD-10-CM | POA: Insufficient documentation

## 2013-02-06 DIAGNOSIS — Z880 Allergy status to penicillin: Secondary | ICD-10-CM | POA: Insufficient documentation

## 2013-02-06 HISTORY — DX: Acute embolism and thrombosis of unspecified deep veins of unspecified lower extremity: I82.409

## 2013-02-06 LAB — CBC WITH DIFFERENTIAL/PLATELET
Eosinophils Absolute: 0.2 10*3/uL (ref 0.0–0.7)
Eosinophils Relative: 3 % (ref 0–5)
HCT: 39.7 % (ref 39.0–52.0)
Lymphocytes Relative: 24 % (ref 12–46)
Lymphs Abs: 1.5 10*3/uL (ref 0.7–4.0)
MCH: 30.3 pg (ref 26.0–34.0)
MCV: 85.4 fL (ref 78.0–100.0)
Monocytes Absolute: 0.4 10*3/uL (ref 0.1–1.0)
Platelets: 257 10*3/uL (ref 150–400)
RBC: 4.65 MIL/uL (ref 4.22–5.81)
RDW: 12.6 % (ref 11.5–15.5)
WBC: 6.4 10*3/uL (ref 4.0–10.5)

## 2013-02-06 LAB — COMPREHENSIVE METABOLIC PANEL
CO2: 29 mEq/L (ref 19–32)
Calcium: 9.4 mg/dL (ref 8.4–10.5)
Creatinine, Ser: 1.23 mg/dL (ref 0.50–1.35)
GFR calc Af Amer: 70 mL/min — ABNORMAL LOW (ref >90)
GFR calc non Af Amer: 61 mL/min — ABNORMAL LOW (ref >90)
Glucose, Bld: 142 mg/dL — ABNORMAL HIGH (ref 70–99)
Total Protein: 6.8 g/dL (ref 6.0–8.3)

## 2013-02-06 LAB — PROTIME-INR
INR: 2.23 — ABNORMAL HIGH (ref 0.00–1.49)
Prothrombin Time: 23.7 seconds — ABNORMAL HIGH (ref 11.6–15.2)

## 2013-02-06 NOTE — ED Notes (Signed)
PT. REPORTS RIGHT LOWER LEG SWELLING ONSET TODAY DENIES INJURY , CURRENTLY TAKING XARELTO FOR DVT.

## 2013-02-07 ENCOUNTER — Emergency Department (HOSPITAL_COMMUNITY)
Admission: EM | Admit: 2013-02-07 | Discharge: 2013-02-07 | Disposition: A | Discharge status: Home / Self Care | Payer: BC Managed Care – PPO | Attending: Emergency Medicine | Admitting: Emergency Medicine

## 2013-02-07 ENCOUNTER — Encounter (HOSPITAL_COMMUNITY): Payer: Self-pay | Admitting: Emergency Medicine

## 2013-02-07 ENCOUNTER — Ambulatory Visit (HOSPITAL_COMMUNITY)
Admission: RE | Admit: 2013-02-07 | Discharge: 2013-02-07 | Disposition: A | Discharge status: Home / Self Care | Payer: BC Managed Care – PPO | Source: Ambulatory Visit | Attending: Emergency Medicine | Admitting: Emergency Medicine

## 2013-02-07 DIAGNOSIS — Z96659 Presence of unspecified artificial knee joint: Secondary | ICD-10-CM | POA: Insufficient documentation

## 2013-02-07 DIAGNOSIS — Z8639 Personal history of other endocrine, nutritional and metabolic disease: Secondary | ICD-10-CM | POA: Insufficient documentation

## 2013-02-07 DIAGNOSIS — I82401 Acute embolism and thrombosis of unspecified deep veins of right lower extremity: Secondary | ICD-10-CM

## 2013-02-07 DIAGNOSIS — M79609 Pain in unspecified limb: Secondary | ICD-10-CM | POA: Insufficient documentation

## 2013-02-07 DIAGNOSIS — I2699 Other pulmonary embolism without acute cor pulmonale: Secondary | ICD-10-CM | POA: Insufficient documentation

## 2013-02-07 DIAGNOSIS — Z7901 Long term (current) use of anticoagulants: Secondary | ICD-10-CM | POA: Insufficient documentation

## 2013-02-07 DIAGNOSIS — Z862 Personal history of diseases of the blood and blood-forming organs and certain disorders involving the immune mechanism: Secondary | ICD-10-CM | POA: Insufficient documentation

## 2013-02-07 DIAGNOSIS — I824Y9 Acute embolism and thrombosis of unspecified deep veins of unspecified proximal lower extremity: Secondary | ICD-10-CM | POA: Insufficient documentation

## 2013-02-07 DIAGNOSIS — Z79899 Other long term (current) drug therapy: Secondary | ICD-10-CM | POA: Insufficient documentation

## 2013-02-07 DIAGNOSIS — I82409 Acute embolism and thrombosis of unspecified deep veins of unspecified lower extremity: Secondary | ICD-10-CM | POA: Insufficient documentation

## 2013-02-07 DIAGNOSIS — Z880 Allergy status to penicillin: Secondary | ICD-10-CM | POA: Insufficient documentation

## 2013-02-07 DIAGNOSIS — I10 Essential (primary) hypertension: Secondary | ICD-10-CM | POA: Insufficient documentation

## 2013-02-07 DIAGNOSIS — Z86711 Personal history of pulmonary embolism: Secondary | ICD-10-CM | POA: Insufficient documentation

## 2013-02-07 DIAGNOSIS — Z7982 Long term (current) use of aspirin: Secondary | ICD-10-CM | POA: Insufficient documentation

## 2013-02-07 MED ORDER — APIXABAN 2.5 MG PO TABS: 2.5000 mg | ORAL_TABLET | Freq: Two times a day (BID) | ORAL | Status: DC

## 2013-02-07 MED ORDER — ENOXAPARIN SODIUM 100 MG/ML ~~LOC~~ SOLN
100.0000 mg | Freq: Once | SUBCUTANEOUS | Status: AC
  Administered 2013-02-07: 100 mg via SUBCUTANEOUS

## 2013-02-07 MED ORDER — APIXABAN 2.5 MG PO TABS
2.5000 mg | ORAL_TABLET | Freq: Once | ORAL | Status: AC
  Administered 2013-02-07: 2.5 mg via ORAL
  Filled 2013-02-07: qty 1

## 2013-02-07 NOTE — ED Provider Notes (Signed)
History     CSN: [id number]  Arrival date & time 02/07/13  4098   First MD Initiated Contact with Patient 02/07/13 1116      Chief Complaint  Patient presents with  . DVT    (Consider location/radiation/quality/duration/timing/severity/associated sxs/prior treatment) The history is provided by the patient.  Craig [name] is a 62 y.o. male history of hypertension, R leg DVT with PE, here presenting with right leg pain. Was recently diagnosed with DVT in the right leg as well as a pulmonary embolus. He was in the hospital and was discharged on xarelto. He noticed that he had worse her right leg swelling yesterday and came to the ED. He was told to come back this morning for a right lower extremity duplex which showed extensive DVT similar to prior.  Denies short of breath or palpitations. He has been taking his Xarelto as prescribed and stopped his testosterone.    Past Medical History  Diagnosis Date  . Hypertension   . Hypogonadism male   . DVT (deep venous thrombosis)     Past Surgical History  Procedure Laterality Date  . Knee arthroscopy    . Cardiac catheterization  2006    no blockage    Family History  Problem Relation Age of Onset  . Diabetes Mother   . Cancer Mother   . Diabetes Father     History  Substance Use Topics  . Smoking status: Never Smoker   . Smokeless tobacco: Not on file  . Alcohol Use: No      Review of Systems  Musculoskeletal:       R leg pain   All other systems reviewed and are negative.    Allergies  Penicillins  Home Medications   Current Outpatient Rx  Name  Route  Sig  Dispense  Refill  . aspirin 81 MG tablet   Oral   Take 81 mg by mouth daily.         . Multiple Vitamin (MULTIVITAMIN WITH MINERALS) TABS   Oral   Take 1 tablet by mouth daily.         . nebivolol (BYSTOLIC) 5 MG tablet   Oral   Take 5 mg by mouth every other day.         . Rivaroxaban (XARELTO) 20 MG TABS   Oral   Take 1 tablet (20 mg  total) by mouth daily with supper. Start this after 21 days  The 1st 21 days is 15 mg twice a day.   30 tablet   5     BP 131/78  Pulse 49  Temp(Src) 98.6 F (37 C) (Oral)  Resp 17  Ht 5' 3.5" (1.613 m)  Wt 221 lb (100.245 kg)  BMI 38.53 kg/m2  SpO2 98%  Physical Exam  Nursing note and vitals reviewed. Constitutional: He is oriented to person, place, and time. He appears well-developed and well-nourished.  HENT:  Head: Normocephalic.  Mouth/Throat: Oropharynx is clear and moist.  Eyes: Conjunctivae are normal. Pupils are equal, round, and reactive to light.  Neck: Normal range of motion. Neck supple. No thyromegaly present.  Cardiovascular: Normal rate, regular rhythm and normal heart sounds.   Pulmonary/Chest: Effort normal and breath sounds normal. No respiratory distress. He has no wheezes. He has no rales.  Abdominal: Soft. Bowel sounds are normal. He exhibits no distension. There is no tenderness. There is no rebound and no guarding.  Musculoskeletal:  R leg 1+ edema, + calf tenderness, 2+ pulses  Neurological: He is alert and oriented to person, place, and time.  Skin: Skin is warm and dry.  Psychiatric: He has a normal mood and affect. His behavior is normal. Judgment and thought content normal.    ED Course  Procedures (including critical care time)  Labs Reviewed - No data to display No results found.   No diagnosis found.    MDM  Craig [name] is a 62 y.o. male here with R calf pain. Duplex showed DVT unchanged. Will consult [facility] cardiology for recs for anticoagulation.   11:45 AM  I called [facility] cardiology and talked to Dr. Elissa Hefty PA. Dr. Herbie Baltimore recommend to start eliquis and d/c xarelto once he can fill the script. He was given a dose in the ED. He will f/u in a week. 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 Return precautions given. No signs for massive PE at this point. Hemodynamically stable.          Craig Canal, MD 02/07/13 1256

## 2013-02-07 NOTE — ED Provider Notes (Signed)
History     CSN: [id number]  Arrival date & time 02/06/13  2229   First MD Initiated Contact with Patient 02/07/13 0016      Chief Complaint  Patient presents with  . Leg Swelling    (Consider location/radiation/quality/duration/timing/severity/associated sxs/prior treatment) HPI This is a 62 year old male who was diagnosed with right lower extremity deep vein thrombosis late last month. He is on Xarelto for this. He is here this evening because his right lower extremity became acutely edematous today. The edema is moderate. There is no associated pain although he does have some chronic pain in his knees. He denies chest pain or shortness of breath. He states he has been compliant with his Xarelto. The right lower extremity is not warm or erythematous.  Past Medical History  Diagnosis Date  . Hypertension   . Hypogonadism male   . DVT (deep venous thrombosis)     Past Surgical History  Procedure Laterality Date  . Knee arthroscopy    . Cardiac catheterization  2006    no blockage    Family History  Problem Relation Age of Onset  . Diabetes Mother   . Cancer Mother   . Diabetes Father     History  Substance Use Topics  . Smoking status: Never Smoker   . Smokeless tobacco: Not on file  . Alcohol Use: No      Review of Systems  All other systems reviewed and are negative.    Allergies  Penicillins  Home Medications   Current Outpatient Rx  Name  Route  Sig  Dispense  Refill  . aspirin 81 MG tablet   Oral   Take 81 mg by mouth daily.         . Multiple Vitamin (MULTIVITAMIN WITH MINERALS) TABS   Oral   Take 1 tablet by mouth daily.         . nebivolol (BYSTOLIC) 5 MG tablet   Oral   Take 5 mg by mouth every other day.         . Rivaroxaban (XARELTO) 15 MG TABS tablet   Oral   Take 1 tablet (15 mg total) by mouth 2 (two) times daily before a meal.   41 tablet   0   . Rivaroxaban (XARELTO) 20 MG TABS   Oral   Take 1 tablet (20 mg total)  by mouth daily with supper. Start this after 21 days  The 1st 21 days is 15 mg twice a day.   30 tablet   5     BP 138/91  Pulse 99  Temp(Src) 98.2 F (36.8 C) (Oral)  SpO2 96%  Physical Exam General: Well-developed, well-nourished male in no acute distress; appearance consistent with age of record HENT: normocephalic, atraumatic Eyes: pupils equal round and reactive to light; extraocular muscles intact Neck: supple Heart: regular rate and rhythm Lungs: clear to auscultation bilaterally Abdomen: soft; nondistended Extremities: No deformity; full range of motion; pulses normal; 2+ the edema of right lower extremity without erythema or warmth Neurologic: Awake, alert and oriented; motor function intact in all extremities and symmetric; no facial droop Skin: Warm and dry Psychiatric: Normal mood and affect    ED Course  Procedures (including critical care time)     MDM   Nursing notes and vitals signs, including pulse oximetry, reviewed.  Summary of this visit's results, reviewed by myself:  Labs:  Results for orders placed during the hospital encounter of 02/06/13 (from the past 24 hour(s))  CBC  WITH DIFFERENTIAL     Status: None   Collection Time    02/06/13 11:06 PM      Result Value Range   WBC 6.4  4.0 - 10.5 K/uL   RBC 4.65  4.22 - 5.81 MIL/uL   Hemoglobin 14.1  13.0 - 17.0 g/dL   HCT 16.1  09.6 - 04.5 %   MCV 85.4  78.0 - 100.0 fL   MCH 30.3  26.0 - 34.0 pg   MCHC 35.5  30.0 - 36.0 g/dL   RDW 40.9  81.1 - 91.4 %   Platelets 257  150 - 400 K/uL   Neutrophils Relative % 66  43 - 77 %   Neutro Abs 4.2  1.7 - 7.7 K/uL   Lymphocytes Relative 24  12 - 46 %   Lymphs Abs 1.5  0.7 - 4.0 K/uL   Monocytes Relative 6  3 - 12 %   Monocytes Absolute 0.4  0.1 - 1.0 K/uL   Eosinophils Relative 3  0 - 5 %   Eosinophils Absolute 0.2  0.0 - 0.7 K/uL   Basophils Relative 1  0 - 1 %   Basophils Absolute 0.0  0.0 - 0.1 K/uL  COMPREHENSIVE METABOLIC PANEL     Status:  Abnormal   Collection Time    02/06/13 11:06 PM      Result Value Range   Sodium 141  135 - 145 mEq/L   Potassium 3.9  3.5 - 5.1 mEq/L   Chloride 104  96 - 112 mEq/L   CO2 29  19 - 32 mEq/L   Glucose, Bld 142 (*) 70 - 99 mg/dL   BUN 12  6 - 23 mg/dL   Creatinine, Ser 7.82  0.50 - 1.35 mg/dL   Calcium 9.4  8.4 - 95.6 mg/dL   Total Protein 6.8  6.0 - 8.3 g/dL   Albumin 3.2 (*) 3.5 - 5.2 g/dL   AST 21  0 - 37 U/L   ALT 19  0 - 53 U/L   Alkaline Phosphatase 69  39 - 117 U/L   Total Bilirubin 0.5  0.3 - 1.2 mg/dL   GFR calc non Af Amer 61 (*) >90 mL/min   GFR calc Af Amer 70 (*) >90 mL/min  PROTIME-INR     Status: Abnormal   Collection Time    02/06/13 11:06 PM      Result Value Range   Prothrombin Time 23.7 (*) 11.6 - 15.2 seconds   INR 2.23 (*) 0.00 - 1.49   Will give Lovenox and repeat Doppler study.          Hanley Seamen, MD 02/07/13 [phone]

## 2013-02-07 NOTE — ED Notes (Signed)
Pt discharged during downtime. Vascular lab called and message left.

## 2013-02-07 NOTE — Progress Notes (Signed)
Right lower extremity venous duplex completed.  Right:  DVT noted in the common femoral, femoral, profunda, popliteal, posterior tibial, and peroneal veins.  No evidence of superficial thrombosis.  No Baker's cyst.  Left:  Negative for DVT in the common femoral vein.

## 2013-02-07 NOTE — ED Notes (Addendum)
Pt had ultrasound done today and discovered that pt has DVT in right leg. Ultrasound spoke with Dr Ignacia Palma and was told to have pt come to ED. Pt has DVT 3 weeks ago and was admitted to hospital at that time. Pt denies shortness of breath or chest pain at present.

## 2013-02-12 ENCOUNTER — Ambulatory Visit (INDEPENDENT_AMBULATORY_CARE_PROVIDER_SITE_OTHER): Payer: BC Managed Care – PPO | Admitting: Physician Assistant

## 2013-02-12 ENCOUNTER — Encounter: Payer: Self-pay | Admitting: Physician Assistant

## 2013-02-12 VITALS — BP 140/68 | HR 61 | Ht 64.0 in | Wt 220.4 lb

## 2013-02-12 DIAGNOSIS — I2699 Other pulmonary embolism without acute cor pulmonale: Secondary | ICD-10-CM

## 2013-02-12 DIAGNOSIS — E669 Obesity, unspecified: Secondary | ICD-10-CM

## 2013-02-12 DIAGNOSIS — I82401 Acute embolism and thrombosis of unspecified deep veins of right lower extremity: Secondary | ICD-10-CM

## 2013-02-12 DIAGNOSIS — I82409 Acute embolism and thrombosis of unspecified deep veins of unspecified lower extremity: Secondary | ICD-10-CM

## 2013-02-12 DIAGNOSIS — I10 Essential (primary) hypertension: Secondary | ICD-10-CM

## 2013-02-12 MED ORDER — RIVAROXABAN 15 MG PO TABS: ORAL_TABLET | ORAL | Status: DC

## 2013-02-12 NOTE — Assessment & Plan Note (Addendum)
Will obtain 2d echo in six months to assess right ventricle and auscultated murmur

## 2013-02-12 NOTE — Assessment & Plan Note (Signed)
Fairly well-controlled. Treated by primary care provider.

## 2013-02-12 NOTE — Patient Instructions (Addendum)
Take Xarelto samples, 15mg  twice daily until gone, then start the prescription for the 20mg  tablets. The 20 mg tablets will be taken once daily with your evening meal.  Your physician has requested that you have a lower or upper extremity venous duplex. This test is an ultrasound of the veins in the legs. It looks at venous blood flow that carries blood from the heart to the legs.  Allow one hour for a Lower Venous exam. Allow thirty minutes for an Upper Venous exam. There are no restrictions or special instructions.  Your test will be scheduled for 3months from now and you will follow-up with a appointment to see  Dr. Herbie Baltimore.

## 2013-02-12 NOTE — Assessment & Plan Note (Addendum)
Discontinue Eliquis.  Restart Xarelto15 mg twice daily for 2 weeks then start 20 mg daily thereafter.  Will obtain repeat lower extremity venous doppers in three months.  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 Was also recommended that he stay at work until next Monday, May 26

## 2013-02-12 NOTE — Progress Notes (Signed)
Patient ID: Craig [name], male   DOB: Feb 23, 1951, 62 y.o.   MRN: [id number] Date:  02/12/2013   ID:  Craig [name], DOB May 18, 1951, MRN [id number]  PCP:  Laurena Slimmer, MD  Primary Cardiologist:  Herbie Baltimore    History of Present Illness: Craig [name] is a 62 y.o. male obese male with a history of hypertension, hypogonadism, osteoarthritis of both knees. He was diagnosed with right lower extremity DVT and pulmonary embolism at the end of April this year. He was then started on Zaroxolyn. He reported back to the emergency room 2 weeks later with increasing swelling repeat lower [place] he venous Doppler showed no change in the level of thrombus. Due to misinformation with the emergency room, doctor Herbie Baltimore was under the impression there was increasing in clot burden. The patient was switched to Eliquis.  The patient presented today for followup from the emergency room visit.  After review of tests the repeat lower extremity venous Doppler showed no change in overall thrombus burden. Patient's is wearing compression hose and states that his lower extreme edema has subsequently gotten better. He does report increased activity level prior to his second visit to the emergency room.  Patient denies nausea, vomiting, chest pain, shortness of breath, orthopnea, PND, abdominal pain.   Wt Readings from Last 3 Encounters:  02/12/13 220 lb 6.4 oz (99.973 kg)  02/07/13 221 lb (100.245 kg)  01/23/13 221 lb 4.8 oz (100.381 kg)     Past Medical History  Diagnosis Date  . Hypertension   . Hypogonadism male   . DVT (deep venous thrombosis)   . Osteoarthritis of both knees     Current Outpatient Prescriptions  Medication Sig Dispense Refill  . apixaban (ELIQUIS) 2.5 MG TABS tablet Take 1 tablet (2.5 mg total) by mouth 2 (two) times daily.  30 tablet  0  . aspirin 81 MG tablet Take 81 mg by mouth daily.      . Multiple Vitamin (MULTIVITAMIN WITH MINERALS) TABS Take 1 tablet by mouth daily.      . nebivolol (BYSTOLIC)  5 MG tablet Take 5 mg by mouth every other day.      Melene Muller ON 02/13/2013] Rivaroxaban (XARELTO) 20 MG TABS Take 1 tablet (20 mg total) by mouth daily with supper. Start this after 21 days  The 1st 21 days is 15 mg twice a day.  30 tablet  5   No current facility-administered medications for this visit.   Family History  Problem Relation Age of Onset  . Diabetes Mother   . Cancer Mother   . Diabetes Father     Allergies:    Allergies  Allergen Reactions  . Penicillins Rash    Social History:  The patient  reports that he has never smoked. He does not have any smokeless tobacco history on file. He reports that he does not drink alcohol or use illicit drugs.   ROS:  Please see the history of present illness.  All other systems reviewed and negative.   PHYSICAL EXAM: VS:  BP 140/68  Pulse 61  Ht 5\' 4"  (1.626 m)  Wt 220 lb 6.4 oz (99.973 kg)  BMI 37.81 kg/m2 Well nourished, well developed, in no acute distress HEENT: Pupils are equal round react to light accommodation extraocular movements are intact.  Neck: No cervical lymphadenopathy. Cardiac: Regular rate and rhythm with 1/6 systolic murmur, no rubs or gallops. Lungs:  clear to auscultation bilaterally, no wheezing, rhonchi or rales Abd: soft, nontender,  positive bowel sounds all quadrants, no hepatosplenomegaly Ext: 1+ lower extremity edema.  2+ radial and dorsalis pedis pulses. Skin: warm and dry Neuro:  Grossly normal  EKG:  Sinus rhythm rate 220s per minute occasional PVC incomplete right bundle     ASSESSMENT AND PLAN:  Problem List Items Addressed This Visit   Pulmonary embolism     Will obtain 2d echo in three months to assess right ventricle and auscultated Murmur    Relevant Orders      EKG 12-Lead   DVT (deep venous thrombosis) - Primary     Discontinue Eliquis.  Restart Xarelto15 mg twice daily for 2 weeks then start 20 mg daily thereafter.  Will obtain repeat lower extremity venous doppers in three  months.    Hypertension     Fairly well-controlled. Treated by primary care provider.     Other Visit Diagnoses   Obesity

## 2013-02-13 ENCOUNTER — Telehealth: Payer: Self-pay | Admitting: Cardiology

## 2013-02-13 NOTE — Telephone Encounter (Signed)
Returned call.  Left message to call back.  Pt will need to come in to sign a ROI form here or he can return to the hospital to request the note he was given.  Will inform pt when call returned.

## 2013-02-13 NOTE — Telephone Encounter (Signed)
Craig [name] is calling because he  left his instructions about returning to work and he needs the instructions to apply for his short term disability and would like know if he can come and pick up another work note. Please call at [phone]   Thanks

## 2013-02-14 NOTE — Telephone Encounter (Signed)
Returned call.  Pt stated when he was seen on Monday he was given a handwritten note for work so that he could apply for short-term disability.  Stated he cannot find the note and would like a copy.  Pt informed RN is unable to locate a copy of note.  Will notify Dr. Donneta Romberg, RN for more information.  Pt verbalized understanding and stated he needs to turn in the note within 48hrs.  Pt. Informed they will be notified and will try to locate today.  Will call once more information available.  Pt would like callback on his cell at [phone].

## 2013-02-14 NOTE — Telephone Encounter (Signed)
Craig [name] - do you remember this guy.  I can't remember what we said re: return to work.  He lost his note.   We can do via communication thing - if u remember what we said -- just respond to this note & I can do a letter.

## 2013-02-15 ENCOUNTER — Telehealth: Payer: Self-pay | Admitting: *Deleted

## 2013-02-15 ENCOUNTER — Encounter: Payer: Self-pay | Admitting: Cardiology

## 2013-02-15 NOTE — Telephone Encounter (Signed)
Pt called back r/t work note.  Pt informed Dr. Herbie Baltimore does not remember the decision r/t work and is asking B. Leron Croak, PA-C to verify details of letter.  Pt verbalized understanding.  Pt stated he was told to stay out of work the rest of this week and to return to work on Tuesday, May 27th after the holiday.  Pt informed providers will be notified and I will call back after I have received a response.  Pt verbalized understanding and agreed w/ plan.  **Call mobile number when calling back.

## 2013-02-15 NOTE — Telephone Encounter (Signed)
Call to pt and informed letter at front desk for pick-up.  Pt verbalized understanding and agreed w/ plan.

## 2013-02-15 NOTE — Telephone Encounter (Signed)
Letter done - look in Letters section.  If you could print it out for him I would appreciate it.

## 2013-02-15 NOTE — Telephone Encounter (Signed)
I can write a letter & forward it to you.  Will not be able to sign it, but it will be official (maybe Vernona Rieger or one of the MDs there today can sign on my behalf  Marykay Lex, MD

## 2013-02-15 NOTE — Telephone Encounter (Signed)
Error

## 2013-05-10 ENCOUNTER — Telehealth (HOSPITAL_COMMUNITY): Payer: Self-pay | Admitting: Physician Assistant

## 2013-05-15 ENCOUNTER — Ambulatory Visit: Payer: BC Managed Care – PPO | Admitting: Cardiology

## 2013-05-18 ENCOUNTER — Ambulatory Visit (HOSPITAL_COMMUNITY)
Admission: RE | Admit: 2013-05-18 | Discharge: 2013-05-18 | Disposition: A | Discharge status: Home / Self Care | Payer: BC Managed Care – PPO | Source: Ambulatory Visit | Attending: Physician Assistant | Admitting: Physician Assistant

## 2013-05-18 DIAGNOSIS — M7989 Other specified soft tissue disorders: Secondary | ICD-10-CM

## 2013-05-18 DIAGNOSIS — I82409 Acute embolism and thrombosis of unspecified deep veins of unspecified lower extremity: Secondary | ICD-10-CM | POA: Insufficient documentation

## 2013-05-18 DIAGNOSIS — M79609 Pain in unspecified limb: Secondary | ICD-10-CM

## 2013-05-18 DIAGNOSIS — I82401 Acute embolism and thrombosis of unspecified deep veins of right lower extremity: Secondary | ICD-10-CM

## 2013-05-18 HISTORY — PX: OTHER SURGICAL HISTORY: SHX169

## 2013-05-18 NOTE — Progress Notes (Addendum)
Right Lower Ext. Venous Duplex Completed. Preliminary results by tech. Non-obstructing .DVT still present in the femoral, popliteal and calf veins.  Marilynne Halsted, BS, RDMS, RVT

## 2013-05-25 ENCOUNTER — Telehealth: Payer: Self-pay | Admitting: *Deleted

## 2013-05-25 NOTE — Telephone Encounter (Signed)
Message copied by Tobin Chad on Fri May 25, 2013  3:48 PM ------      Message from: Herbie Baltimore, DAVID      Created: Wed May 23, 2013  3:31 PM       Looks like the worst of the clot has cleared - just starting to "layer" -- will eventually absorb.              Can discuss what we do now in f/u.      Marykay Lex, MD       ------

## 2013-05-25 NOTE — Telephone Encounter (Signed)
Spoke to patient. Result given. Appointment already schedule 06/19/13

## 2013-06-19 ENCOUNTER — Ambulatory Visit (INDEPENDENT_AMBULATORY_CARE_PROVIDER_SITE_OTHER): Payer: BC Managed Care – PPO | Admitting: Cardiology

## 2013-06-19 ENCOUNTER — Encounter: Payer: Self-pay | Admitting: Cardiology

## 2013-06-19 VITALS — BP 149/92 | HR 60 | Ht 64.0 in | Wt 221.9 lb

## 2013-06-19 DIAGNOSIS — I2699 Other pulmonary embolism without acute cor pulmonale: Secondary | ICD-10-CM

## 2013-06-19 DIAGNOSIS — I82409 Acute embolism and thrombosis of unspecified deep veins of unspecified lower extremity: Secondary | ICD-10-CM

## 2013-06-19 DIAGNOSIS — E669 Obesity, unspecified: Secondary | ICD-10-CM

## 2013-06-19 DIAGNOSIS — I10 Essential (primary) hypertension: Secondary | ICD-10-CM

## 2013-06-19 DIAGNOSIS — I82401 Acute embolism and thrombosis of unspecified deep veins of right lower extremity: Secondary | ICD-10-CM

## 2013-06-19 NOTE — Patient Instructions (Addendum)
Continue Xarelto through the January prescription.  We will recheck your leg dopplers at that time to make sure that the clot has fully cleared or stabilized.  --> this is in order to make a good decision about your potential Knee surgery.  I will see you back after that in ~ February.  Marykay Lex, MD

## 2013-06-19 NOTE — Progress Notes (Signed)
PCP: Laurena Slimmer, MD  Clinic Note: Chief Complaint  Patient presents with  . 3 MONTH VISIT    RESULT OF DOPPLER 04/2013; NO CHEST PAIN ,NOS SOB , SWELLING OCCASIONALLY-WEARS COMPRESSION SOCKS,TREATMENT ON KNEES RECEIVED CORTISONE INJECTION YESTERDAY   HPI: Craig [name] is a 62 y.o. male with a PMH below who presents today for followup visit of DVT/PE. He saw Mr. Wilburt Finlay on May 19 for initial post hospital evaluation.Marland Kitchen He was diagnosed with a right lower STEMI DVT and embolus in April. He was initially started on Xarelto. He then went back and 2 weeks later with increased swelling, but no change in clot burden. Initial understanding was that there was change in clot burden therefore he was verbally converted to Eliquis , but when he was seen by Mr. Leron Croak was switched back to Xarelto (at that time, Eliquis did not have FDA approval for the treatment).   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 This developed a relatively large thrombus burden that subsequently embolized.  Interval History:  he now presents after his three-month followup venous Dopplers. He says that following our recommendations as far as every 90 minutes during his drive, he will get up to walk 10-15 minutes. He has not had any recurrence of edema or pain the right leg. He does have some arthritic pain in his right knee. He denies any occurrence of dyspnea with rest or exertion no chest tightness or pressure with rest or exertion.   The remainder of Cardiovascular ROS: negative for - edema, irregular heartbeat, loss of consciousness, murmur, orthopnea, palpitations, paroxysmal nocturnal dyspnea, rapid heart rate or shortness of breath is as follows: Additional cardiac review of systems: Lightheadedness - no, dizziness - no, syncope/near-syncope - no; TIA/amaurosis fugax - no Melena - no, hematochezia no; hematuria - no; nosebleeds - no; claudication -  no  Past Medical History  Diagnosis Date  . Hypertension   . Hypogonadism male   . DVT (deep venous thrombosis)   . Osteoarthritis of both knees    Prior Cardiac Evaluation and Past Surgical History: Past Surgical History  Procedure Laterality Date  . Knee arthroscopy    . Cardiac catheterization  2006    no blockage  . Carpal tunnel release  2009    Left  . Venous doppler, lower extremity Right 05/18/2013    Chronic appearing DVT with some recanalization involving the femoral vein, posterior tibial and peroneal veins. There does appear to be partial resolution of the common femoral vein and popliteal pain thrombus with residual findings distally.   Allergies  Allergen Reactions  . Penicillins Rash   Current Outpatient Prescriptions  Medication Sig Dispense Refill  . aspirin 81 MG tablet Take 81 mg by mouth daily.      Marland Kitchen GLUCOSAMINE HCL PO Take by mouth.      . Multiple Vitamin (MULTIVITAMIN WITH MINERALS) TABS Take 1 tablet by mouth daily.      . nebivolol (BYSTOLIC) 5 MG tablet Take 5 mg by mouth every other day.      . Rivaroxaban (XARELTO) 20 MG TABS Take 1 tablet (20 mg total) by mouth daily with supper. Start this after 21 days  The 1st 21 days is 15 mg twice a day.  30 tablet  5   No current facility-administered medications for this visit.   History   Social History Narrative   He is married truck Hospital doctor.  He plays tennis avidly, but is really been bothered by his knees recently. For that reason has not been out exercising much. He was really to work on his weight loss. Unfortunately of the PE his knees really slowed her progress. He does not drink alcohol and does not smoke.   ROS: A comprehensive Review of Systems - Negative except Pertinent positives above as well as minor noncardiac issues below. Musculoskeletal ROS: Right knee osteoarthritis  PHYSICAL EXAM BP 149/92  Pulse 60  Ht 5\' 4"  (1.626 m)  Wt 221 lb 14.4 oz (100.653 kg)  BMI 38.07 kg/m2 Well  nourished, well developed, in no acute distress  HEENT: Pupils are equal round react to light accommodation extraocular movements are intact.  Neck: No cervical lymphadenopathy.  Cardiac: Regular rate and rhythm with 1/6 systolic murmur, no rubs or gallops.  Lungs: clear to auscultation bilaterally, no wheezing, rhonchi or rales  Abd: soft, nontender, positive bowel sounds all quadrants, no hepatosplenomegaly  Ext: 1+ lower extremity edema. 2+ radial and dorsalis pedis pulses.  Skin: warm and dry  Neuro: Grossly normal  WUJ:[id number] today: No  Recent Labs: no  ASSESSMENT / PLAN: Pulmonary embolism No long-term side effects from the PE. He has no signs of pulmonary hypertension or dyspnea. Unfortunately, he did not have an echocardiogram performed at that time his PE. It would nice to do documented pulmonary pressures. I do agree that Mr. Leron Croak is planned to obtain an six-month post PE echocardiogram. This should be ordered. He also had a murmur that we wanted to evaluate.  Plan: Ensure that echo was ordered for the next month or so.  For now I continued the Xarelto for least a 9 month. Simply because of the residual thrombus in the right lower extremity. Would reassess in February. At that time we can potentially check a hypercoagulable workup.  DVT (deep venous thrombosis) Continue Xarelto for a minimum of 9 months and reassess in 10 months with Doppler, to ensure complete resolution of the lotion thrombus.  I think he should be fine getting back to exercise. 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  Hypertension This is been managed by primary physician. Is a little elevated today but he was in a hurry coming in. He is on Bystolic 5 mg. 2 the base of this size wouldn't be surprised if we go up to 10. I'll defer this to his primary physician.  Obesity (BMI 30-39.9) We did talk about him continue to get  back into exercise and dietary modification to he had been reluctant upper for performed to do so. The bases weight is stable which is a test into his dietary modification. Hopefully with getting back into his exercise, he will continue to lose weight.     Orders Placed This Encounter  Procedures  . Lower Extremity Venous Duplex Right    Standing Status: Future     Number of Occurrences:      Standing Expiration Date: 06/19/2014    Order Specific Question:  Laterality    Answer:  Right    Order Specific Question:  Where should this test be performed:    Answer:  [facility]   Meds ordered this encounter  Medications  . GLUCOSAMINE HCL PO    Sig: Take by mouth.    Followup:  February 2015  Neave Lenger W. Herbie Baltimore, M.D., M.S. [facility] [address]  [place], [place]  [id number]  [phone] Pager # [phone]

## 2013-06-24 ENCOUNTER — Encounter: Payer: Self-pay | Admitting: Cardiology

## 2013-06-24 DIAGNOSIS — E669 Obesity, unspecified: Secondary | ICD-10-CM | POA: Insufficient documentation

## 2013-06-24 NOTE — Assessment & Plan Note (Signed)
Continue Xarelto for a minimum of 9 months and reassess in 10 months with Doppler, to ensure complete resolution of the lotion thrombus.  I think he should be fine getting back to exercise. 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

## 2013-06-24 NOTE — Assessment & Plan Note (Signed)
This is been managed by primary physician. Is a little elevated today but he was in a hurry coming in. He is on Bystolic 5 mg. 2 the base of this size wouldn't be surprised if we go up to 10. I'll defer this to his primary physician.

## 2013-06-24 NOTE — Assessment & Plan Note (Signed)
We did talk about him continue to get back into exercise and dietary modification to he had been reluctant upper for performed to do so. The bases weight is stable which is a test into his dietary modification. Hopefully with getting back into his exercise, he will continue to lose weight.

## 2013-06-24 NOTE — Assessment & Plan Note (Addendum)
No long-term side effects from the PE. He has no signs of pulmonary hypertension or dyspnea. Unfortunately, he did not have an echocardiogram performed at that time his PE. It would nice to do documented pulmonary pressures. I do agree that Mr. Craig [name] is planned to obtain an six-month post PE echocardiogram. This should be ordered. He also had a murmur that we wanted to evaluate.  Plan: Ensure that echo was ordered for the next month or so.  For now I continued the Xarelto for least a 9 month. Simply because of the residual thrombus in the right lower extremity. Would reassess in February. At that time we can potentially check a hypercoagulable workup.

## 2013-08-16 ENCOUNTER — Other Ambulatory Visit: Payer: Self-pay | Admitting: *Deleted

## 2013-08-16 DIAGNOSIS — I82409 Acute embolism and thrombosis of unspecified deep veins of unspecified lower extremity: Secondary | ICD-10-CM

## 2013-08-16 DIAGNOSIS — I2699 Other pulmonary embolism without acute cor pulmonale: Secondary | ICD-10-CM

## 2013-08-16 DIAGNOSIS — R011 Cardiac murmur, unspecified: Secondary | ICD-10-CM

## 2013-09-12 ENCOUNTER — Telehealth (HOSPITAL_COMMUNITY): Payer: Self-pay | Admitting: *Deleted

## 2013-10-02 ENCOUNTER — Ambulatory Visit (HOSPITAL_COMMUNITY): Payer: BC Managed Care – PPO

## 2013-10-03 ENCOUNTER — Ambulatory Visit (HOSPITAL_COMMUNITY)
Admission: RE | Admit: 2013-10-03 | Discharge: 2013-10-03 | Disposition: A | Discharge status: Home / Self Care | Payer: BC Managed Care – PPO | Source: Ambulatory Visit | Attending: Cardiology | Admitting: Cardiology

## 2013-10-03 DIAGNOSIS — I82409 Acute embolism and thrombosis of unspecified deep veins of unspecified lower extremity: Secondary | ICD-10-CM

## 2013-10-03 DIAGNOSIS — I2699 Other pulmonary embolism without acute cor pulmonale: Secondary | ICD-10-CM

## 2013-10-03 DIAGNOSIS — I517 Cardiomegaly: Secondary | ICD-10-CM

## 2013-10-03 DIAGNOSIS — R011 Cardiac murmur, unspecified: Secondary | ICD-10-CM

## 2013-10-03 DIAGNOSIS — I10 Essential (primary) hypertension: Secondary | ICD-10-CM | POA: Insufficient documentation

## 2013-10-03 NOTE — Progress Notes (Signed)
2D Echo Performed 10/03/2013    Teighlor Korson, RCS  

## 2013-10-26 ENCOUNTER — Ambulatory Visit (HOSPITAL_COMMUNITY)
Admission: RE | Admit: 2013-10-26 | Discharge: 2013-10-26 | Disposition: A | Discharge status: Home / Self Care | Payer: BC Managed Care – PPO | Source: Ambulatory Visit | Attending: Cardiology | Admitting: Cardiology

## 2013-10-26 DIAGNOSIS — I82401 Acute embolism and thrombosis of unspecified deep veins of right lower extremity: Secondary | ICD-10-CM

## 2013-10-26 DIAGNOSIS — I825Y9 Chronic embolism and thrombosis of unspecified deep veins of unspecified proximal lower extremity: Secondary | ICD-10-CM | POA: Insufficient documentation

## 2013-10-26 DIAGNOSIS — I82409 Acute embolism and thrombosis of unspecified deep veins of unspecified lower extremity: Secondary | ICD-10-CM

## 2013-10-26 NOTE — Progress Notes (Signed)
Right Lower Extremity Venous Duplex Completed. °Brianna L Mazza,RVT °

## 2013-11-07 ENCOUNTER — Telehealth: Payer: Self-pay | Admitting: Pharmacist Clinician (PhC)/ Clinical Pharmacy Specialist

## 2013-11-07 NOTE — Telephone Encounter (Signed)
Pt came in to front desk, asking if there were risks of GI bleeding on Xarelto, he'd seen some ads and was concerned.    Brought pt to my office and spent 15 minutes explaining how med works, and that with all these drugs there is a risk of bleeding.  Also explained that pt should not listen to ads about "Bad Drugs", as the drug will do more benefit than harm for him.  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    Pt thanked me for my time.

## 2013-11-09 ENCOUNTER — Telehealth: Payer: Self-pay | Admitting: *Deleted

## 2013-11-09 NOTE — Telephone Encounter (Signed)
Message copied by Tobin ChadMARTIN, Quante Pettry V. on Fri Nov 09, 2013  4:14 PM ------      Message from: Marykay LexHARDING, DAVID W      Created: Sat Nov 03, 2013  5:21 PM       Improved but persistent DVT. It is more distal now.      Unfortunately my recommendation would be to continue Xarelto for now. 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      He can be stopped whenever needed for any procedures.            Marykay LexHARDING,DAVID W, MD       ------

## 2013-11-09 NOTE — Telephone Encounter (Signed)
Spoke to patient. Result given . Verbalized understanding  

## 2013-11-28 ENCOUNTER — Ambulatory Visit (INDEPENDENT_AMBULATORY_CARE_PROVIDER_SITE_OTHER): Payer: BC Managed Care – PPO | Admitting: Family Medicine

## 2013-11-28 VITALS — BP 110/78 | HR 57 | Temp 97.8°F | Resp 16 | Ht 65.0 in | Wt 223.0 lb

## 2013-11-28 DIAGNOSIS — IMO0002 Reserved for concepts with insufficient information to code with codable children: Secondary | ICD-10-CM

## 2013-11-28 MED ORDER — DOXYCYCLINE HYCLATE 100 MG PO TABS: 100.0000 mg | ORAL_TABLET | Freq: Two times a day (BID) | ORAL | Status: DC

## 2013-11-28 NOTE — Patient Instructions (Signed)
Paronychia Paronychia is an inflammatory reaction involving the folds of the skin surrounding the fingernail. This is commonly caused by an infection in the skin around a nail. The most common cause of paronychia is frequent wetting of the hands (as seen with bartenders, food servers, nurses or others who wet their hands). This makes the skin around the fingernail susceptible to infection by bacteria (germs) or fungus. Other predisposing factors are:  Aggressive manicuring.  Nail biting.  Thumb sucking. The most common cause is a staphylococcal (a type of germ) infection, or a fungal (Candida) infection. When caused by a germ, it usually comes on suddenly with redness, swelling, pus and is often painful. It may get under the nail and form an abscess (collection of pus), or form an abscess around the nail. If the nail itself is infected with a fungus, the treatment is usually prolonged and may require oral medicine for up to one year. Your caregiver will determine the length of time treatment is required. The paronychia caused by bacteria (germs) may largely be avoided by not pulling on hangnails or picking at cuticles. When the infection occurs at the tips of the finger it is called felon. When the cause of paronychia is from the herpes simplex virus (HSV) it is called herpetic whitlow. TREATMENT  When an abscess is present treatment is often incision and drainage. This means that the abscess must be cut open so the pus can get out. When this is done, the following home care instructions should be followed. HOME CARE INSTRUCTIONS   It is important to keep the affected fingers very dry. Rubber or plastic gloves over cotton gloves should be used whenever the hand must be placed in water.  Keep wound clean, dry and dressed as suggested by your caregiver between warm soaks or warm compresses.  Soak in warm water for fifteen to twenty minutes three to four times per day for bacterial infections. Fungal  infections are very difficult to treat, so often require treatment for long periods of time.  For bacterial (germ) infections take antibiotics (medicine which kill germs) as directed and finish the prescription, even if the problem appears to be solved before the medicine is gone.  Only take over-the-counter or prescription medicines for pain, discomfort, or fever as directed by your caregiver. SEEK IMMEDIATE MEDICAL CARE IF:  You have redness, swelling, or increasing pain in the wound.  You notice pus coming from the wound.  You have a fever.  You notice a bad smell coming from the wound or dressing. Document Released: 03/09/2001 Document Revised: 12/06/2011 Document Reviewed: 11/08/2008 [employer] Patient Information 2014 [employer].  

## 2013-11-28 NOTE — Progress Notes (Signed)
This chart was scribed for Elvina SidleKurt Lauenstein, MD  by Arlan OrganAshley Leger, [facility] Scribe. This patient was seen in room 1 and the patient's care was started 8:18 PM.    Patient Name: Craig [name] Date of Birth: 07/06/1951 Medical Record Number: [id number] Gender: male Date of Encounter: 11/28/2013  Chief Complaint: thumb pain   History of Present Illness:  Craig [name] is a 63 y.o. very pleasant male patient with a PMHx of HTN, DVT, and osteoarthritis of both knees who presents with the following:  Pt presents with gradual onset, unchanged, mild pain to the left 1st digit that initially started a few days ago. Pt feels his finger may be infected, and associates this with his current discomfort. He has not tried anything OTC for his symptoms, but reports trying warm soaks with no noticeable improvement. At this time he denies any fever or chills. He reports a PMHx of DVT summer 2014. As a result, he is currently taking Xarelto 20 mg as a blood thinner. In addition, he is also prescribed Bystolic 5 mg and a baby aspirin 81 mg. He reports a known allergy to Penicillin. No other concerns or complaints this visit.  Pt currently works at [employer].  Patient Active Problem List   Diagnosis Date Noted   Obesity (BMI 30-39.9) 06/24/2013   Pulmonary embolism 01/23/2013   DVT (deep venous thrombosis) 01/23/2013   Hypertension    Past Medical History  Diagnosis Date   Hypertension    Hypogonadism male    DVT (deep venous thrombosis)    Osteoarthritis of both knees    Past Surgical History  Procedure Laterality Date   Knee arthroscopy     Cardiac catheterization  2006    no blockage   Carpal tunnel release  2009    Left   Venous doppler, lower extremity Right 05/18/2013    Chronic appearing DVT with some recanalization involving the femoral vein, posterior tibial and peroneal veins. There does appear to be partial resolution of the common femoral vein and popliteal  pain thrombus with residual findings distally.   History  Substance Use Topics   Smoking status: Never Smoker    Smokeless tobacco: Not on file   Alcohol Use: No   Family History  Problem Relation Age of Onset   Diabetes Mother    Cancer Mother    Diabetes Father    Allergies  Allergen Reactions   Penicillins Rash    Medication list has been reviewed and updated.  Current Outpatient Prescriptions on File Prior to Visit  Medication Sig Dispense Refill   Multiple Vitamin (MULTIVITAMIN WITH MINERALS) TABS Take 1 tablet by mouth daily.       nebivolol (BYSTOLIC) 5 MG tablet Take 5 mg by mouth every other day.       Rivaroxaban (XARELTO) 20 MG TABS Take 1 tablet (20 mg total) by mouth daily with supper. Start this after 21 days  The 1st 21 days is 15 mg twice a day.  30 tablet  5   aspirin 81 MG tablet Take 81 mg by mouth daily.       GLUCOSAMINE HCL PO Take by mouth.       No current facility-administered medications on file prior to visit.    Review of Systems:    Physical Examination: Filed Vitals:   11/28/13 2009  BP: 110/78  Pulse: 57  Temp: 97.8 F (36.6 C)  Resp: 16   @vitals2 @ Body mass  index is 37.11 kg/(m^2). Ideal Body Weight: @FLOWAMB ([id number])@ Left thumb exam reveals tenderness and mild swelling on the ulnar side of the left thumb cuticle. There Is no obvious abscess formation  EKG / Labs / Xrays: None available at time of encounter  Assessment and Plan: Paronychia - Plan: doxycycline (VIBRA-TABS) 100 MG tablet    I personally performed the services described in this documentation, which was scribed in my presence. The recorded information has been reviewed and is accurate.     Elvina Sidle, MD

## 2013-12-04 ENCOUNTER — Ambulatory Visit (INDEPENDENT_AMBULATORY_CARE_PROVIDER_SITE_OTHER): Payer: BC Managed Care – PPO | Admitting: Physician Assistant

## 2013-12-04 VITALS — BP 124/78 | HR 86 | Temp 98.8°F | Resp 17 | Ht 64.5 in | Wt 222.0 lb

## 2013-12-04 DIAGNOSIS — IMO0002 Reserved for concepts with insufficient information to code with codable children: Secondary | ICD-10-CM

## 2013-12-04 DIAGNOSIS — M79609 Pain in unspecified limb: Secondary | ICD-10-CM

## 2013-12-04 NOTE — Progress Notes (Signed)
   Subjective:    Patient ID: Carolin GuernseyLee A Tory, male    DOB: 02/10/1951, 63 y.o.   MRN: [id number]  HPI   Mr. Mandigo is a very pleasant 63 yr old male here for follow up on a paronychia.  He was initially seen here 11/28/13.  At that time there was no obvious abscess to be drained.  He was started on doxycycline.  He has been taking this as directed and tolerating it well.  He has been soaking the thumb as directed.  He continues to have tenderness and intermittent throbbing.  He denies fever, chills, streaking up arm.   Review of Systems  Constitutional: Negative for fever and chills.  Respiratory: Negative.   Cardiovascular: Negative.   Gastrointestinal: Negative.   Musculoskeletal: Positive for arthralgias (left thumb). Negative for joint swelling.  Skin: Positive for wound (left thumb).  Neurological: Negative for weakness and numbness.       Objective:   Physical Exam  Vitals reviewed. Constitutional: He is oriented to person, place, and time. He appears well-developed and well-nourished. No distress.  HENT:  Head: Normocephalic and atraumatic.  Eyes: Conjunctivae are normal. No scleral icterus.  Pulmonary/Chest: Effort normal.  Musculoskeletal:       Hands: Large, fluctuant paronychia at ulnar side of Left thumb nail  Neurological: He is alert and oriented to person, place, and time.  Skin: Skin is warm and dry.  Psychiatric: He has a normal mood and affect. His behavior is normal.    Procedure Note: Verbal consent obtained from the patient.  Partial digital block with 2cc 2% plain lidocaine.  Betadine prep.  Incision with 11 blade along ulnar side of Left thumb cuticle.  Copious purulence expressed.  Culture collected.  Cleansed and dressed.  Pt tolerated very well.      Assessment & Plan:  Paronychia - Plan: Wound culture   Mr. Rosten is a very pleasant 63 yr old male with paronychia of the Left thumb.  Was seen here on 11/28/13 - started on doxy but at that time there was  nothing to be drained.  Today there is a large, fluctuant area that has been drained per procedure note above.  Cx sent.  Will continue doxy for now and adjust if necessary based on culture.  Encouraged pt to continue warm soaks and keep the area covered for the next few days.   Pt to call or RTC if worsening or not improving  E. Frances FurbishElizabeth Peace Noyes MHS, PA-C [facility] 3/10/20158:34 PM

## 2013-12-08 LAB — WOUND CULTURE
GRAM STAIN: NONE SEEN
GRAM STAIN: NONE SEEN
Gram Stain: NONE SEEN

## 2014-03-12 ENCOUNTER — Other Ambulatory Visit (HOSPITAL_COMMUNITY): Payer: Self-pay | Admitting: Internal Medicine

## 2014-03-12 ENCOUNTER — Ambulatory Visit (HOSPITAL_COMMUNITY)
Admission: RE | Admit: 2014-03-12 | Discharge: 2014-03-12 | Disposition: A | Discharge status: Home / Self Care | Payer: BC Managed Care – PPO | Source: Ambulatory Visit | Attending: Cardiology | Admitting: Cardiology

## 2014-03-12 DIAGNOSIS — I2699 Other pulmonary embolism without acute cor pulmonale: Secondary | ICD-10-CM

## 2014-03-12 DIAGNOSIS — Z880 Allergy status to penicillin: Secondary | ICD-10-CM | POA: Insufficient documentation

## 2014-03-12 DIAGNOSIS — I82409 Acute embolism and thrombosis of unspecified deep veins of unspecified lower extremity: Secondary | ICD-10-CM | POA: Insufficient documentation

## 2014-03-12 NOTE — Progress Notes (Addendum)
Right Lower Ext. Venous duplex completed. Preliminary results - Chronic appearing DVT is still present in the femoral and peroneal veins with new finding of venous insufficieny noted in the femoral vein and popliteal veins.  Marilynne Halstedita Laretha Luepke, BS, RDMS, RVT

## 2014-05-23 ENCOUNTER — Ambulatory Visit (INDEPENDENT_AMBULATORY_CARE_PROVIDER_SITE_OTHER): Payer: BC Managed Care – PPO | Admitting: Cardiology

## 2014-05-23 ENCOUNTER — Encounter: Payer: Self-pay | Admitting: Cardiology

## 2014-05-23 VITALS — BP 130/60 | HR 56 | Ht 64.0 in | Wt 225.2 lb

## 2014-05-23 DIAGNOSIS — I10 Essential (primary) hypertension: Secondary | ICD-10-CM

## 2014-05-23 DIAGNOSIS — I2699 Other pulmonary embolism without acute cor pulmonale: Secondary | ICD-10-CM

## 2014-05-23 DIAGNOSIS — I82409 Acute embolism and thrombosis of unspecified deep veins of unspecified lower extremity: Secondary | ICD-10-CM

## 2014-05-23 DIAGNOSIS — I82401 Acute embolism and thrombosis of unspecified deep veins of right lower extremity: Secondary | ICD-10-CM

## 2014-05-23 DIAGNOSIS — E669 Obesity, unspecified: Secondary | ICD-10-CM

## 2014-05-23 MED ORDER — NEBIVOLOL HCL 5 MG PO TABS
5.0000 mg | ORAL_TABLET | ORAL | Status: DC
Start: 2014-05-23 — End: 2018-10-27

## 2014-05-23 NOTE — Patient Instructions (Addendum)
MAY STOP XARELTO.  START TAKING ASPIRIN 81 MG ONE TABLET DAILY.   Your physician wants you to follow-up in 12 MONTHS  Dr Herbie Baltimore. You will receive a reminder letter in the mail two months in advance. If you don't receive a letter, please call our office to schedule the follow-up appointment.

## 2014-05-25 ENCOUNTER — Encounter: Payer: Self-pay | Admitting: Cardiology

## 2014-05-25 NOTE — Progress Notes (Signed)
PCP: Laurena Slimmer, MD  Clinic Note: Chief Complaint  Patient presents with  . Follow-up    To discuss venous doppler and Xarelto. No complaints. Needs work note to wear compression socks.    HPI: Craig [name] is a 63 y.o. male with a Cardiovascular Problem List below who presents today for what amounts to be a one-year followup after history of lower extremity DVT with pulmonary embolus in April 2014.  He was initially on ELIQUIS but was switched to Xarelto as at that time Eliquis was not FDA approved for DVT/PE. He was actually happy with the once daily dosing.   He is a Agricultural consultant for [employer].  He had followup Dopplers performed which had shown below to have some resolution of thrombus but with a chronic appearing recanalized thrombus in the right femoral and peroneal veins with associated venous insufficiency.  Interval History: He presents today really without any complaints. He does have edema for the right leg and left but it is relatively well-controlled with the support stockings. He unfortunately needs that would support stockings being a different color than his uniform socks for his work, he will require a letter stating that he needs to wear the support stockings. Besides the swelling, he has really been getting back into exercising and watching his diet. While driving he is making sure that he moves his feet and does not go too long without stopping to walk He has not had any further shortness of breath or chest tightness episodes occur PE.  Otherwise from a cardiac standpoint he is stable with essentially no notable symptoms. Cardiac review of systems is as follows: No chest pain or shortness of breath with rest or exertion.  No PND, orthopnea (+ edema as noted).  No palpitations, lightheadedness, dizziness, weakness or syncope/near syncope. No TIA/amaurosis fugax symptoms. No melena, hematochezia, hematuria, or epstaxis. No claudication.  Past Medical History   Diagnosis Date  . Hypertension   . Hypogonadism male   . DVT (deep venous thrombosis)   . Osteoarthritis of both knees     Prior Cardiac Evaluation and Past Surgical History: Past Surgical History  Procedure Laterality Date  . Knee arthroscopy    . Cardiac catheterization  2006    no blockage  . Carpal tunnel release  2009    Left  . Venous doppler, lower extremity Right 05/18/2013    Chronic appearing DVT with some recanalization involving the femoral vein, posterior tibial and peroneal veins. There does appear to be partial resolution of the common femoral vein and popliteal pain thrombus with residual findings distally.   MEDICATIONS AND ALLERGIES REVIEWED IN EPIC No Change in Social and Family History  ROS: A comprehensive Review of Systems - was performed Review of Systems  Constitutional: Negative for malaise/fatigue.  Eyes: Negative for blurred vision and double vision.  Respiratory: Negative for hemoptysis, shortness of breath and wheezing.   Cardiovascular: Positive for leg swelling.       Per HPI  Gastrointestinal: Negative for constipation, blood in stool and melena.  Neurological: Negative.   Endo/Heme/Allergies: Does not bruise/bleed easily.  All other systems reviewed and are negative.  Wt Readings from Last 3 Encounters:  05/23/14 225 lb 3.2 oz (102.15 kg)  12/04/13 222 lb (100.699 kg)  11/28/13 223 lb (101.152 kg)   PHYSICAL EXAM BP 130/60  Pulse 56  Ht  (1.626 m)  Wt 225 lb 3.2 oz (102.15 kg)  BMI 38.64 kg/m2 General appearance: alert, cooperative, appears  stated age, Well nourished, well developed, in no acute distress  HEENT: Pupils are equal round react to light accommodation extraocular movements are intact.  Neck: No cervical lymphadenopathy.  Cardiac: Regular rate and rhythm with 1/6 systolic murmur, no rubs or gallops.  Lungs: clear to auscultation bilaterally, no wheezing, rhonchi or rales  Abd: soft, nontender, positive bowel sounds  all quadrants, no hepatosplenomegaly  Ext: 1+ lower extremity edema  R> L.  2+ radial and dorsalis pedis pulses.  Skin: warm and dry  Neuro: Grossly normal   Adult ECG Report  Rate: 56 ;  Rhythm: sinus bradycardia, Inc RBBB, NSST --Otherwise normal EKG  Recent Labs not available:    Follow-up RLE Venous Dopplers March 12 2014:  Chronic appearing thrombus in the femoral and peroneal veins with recanalization. Venous insufficiency noted in the femoral and popliteal veins.  There has been resolution of thrombus in the posterior tibial vein and improvement in the peroneal veins.  ASSESSMENT / PLAN: Pulmonary embolism He is now almost a year and a half out from his PE. Weight is actually resolving chronic thrombus in the right leg, there is no need to continue with anticoagulation and the absence of recurrent acute event. With his job, it is hard to believe that the DVT that cause his PE not provoked from long term sitting. 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  DVT (deep venous thrombosis) Chronic resolving DVT in the right leg. He does have some venous insufficiency. I do think that because of the venous insufficiency he should wear the compression stockings which will likely potentially help avoid recurrent DVTs and protect against worsening venous insufficiency edema and venostasis ulcers in the future.  Continued to elevate feet when possible. Wear compression stockings last support hose. I am happy to provide a letter for him to use for work as requested Okay to stop Xarelto and switch to aspirin.  Essential hypertension Blood pressure was well controlled on Bystolic.  Obesity (BMI 30-39.9) He is back exercising. He is hoping that he can stay with the [employer] to keep up the exercise and then we'll need to monitor his diet intake to avoid unhealthy eating habits. I congratulated him on his  exercising efforts and encouraged to continue monitoring his diet.    Orders Placed This Encounter  Procedures  . EKG 12-Lead   Meds ordered this encounter  Medications  . DISCONTD: rivaroxaban (XARELTO) 20 MG TABS tablet    Sig: Take 20 mg by mouth daily with supper.  . nebivolol (BYSTOLIC) 5 MG tablet    Sig: Take 1 tablet (5 mg total) by mouth every other day.    Dispense:  30 tablet    Refill:  11    Followup: 12 months  DAVID W. Herbie Baltimore, M.D., M.S. Interventional Cardiologist [facility]

## 2014-05-25 NOTE — Assessment & Plan Note (Signed)
Blood pressure was well controlled on Bystolic.

## 2014-05-25 NOTE — Assessment & Plan Note (Signed)
He is now almost a year and a half out from his PE. Weight is actually resolving chronic thrombus in the right leg, there is no need to continue with anticoagulation and the absence of recurrent acute event. With his job, it is hard to believe that the DVT that cause his PE not provoked from long term sitting. 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

## 2014-05-25 NOTE — Assessment & Plan Note (Signed)
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 He does have some venous insufficiency. I do think that because of the venous insufficiency he should wear the compression stockings which will likely potentially help avoid recurrent DVTs and protect against worsening venous insufficiency edema and venostasis ulcers in the future.  Continued to elevate feet when possible. Wear compression stockings last support hose. I am happy to provide a letter for him to use for work as requested Okay to stop Xarelto and switch to aspirin.

## 2014-05-25 NOTE — Assessment & Plan Note (Signed)
He is back exercising. He is hoping that he can stay with the [employer] to keep up the exercise and then we'll need to monitor his diet intake to avoid unhealthy eating habits. I congratulated him on his exercising efforts and encouraged to continue monitoring his diet.

## 2014-12-16 ENCOUNTER — Ambulatory Visit (INDEPENDENT_AMBULATORY_CARE_PROVIDER_SITE_OTHER): Payer: BLUE CROSS/BLUE SHIELD | Admitting: Cardiology

## 2014-12-16 VITALS — BP 106/64 | HR 53 | Ht 64.5 in | Wt 230.2 lb

## 2014-12-16 DIAGNOSIS — I82401 Acute embolism and thrombosis of unspecified deep veins of right lower extremity: Secondary | ICD-10-CM

## 2014-12-16 DIAGNOSIS — E669 Obesity, unspecified: Secondary | ICD-10-CM

## 2014-12-16 DIAGNOSIS — Z86718 Personal history of other venous thrombosis and embolism: Secondary | ICD-10-CM

## 2014-12-16 DIAGNOSIS — R600 Localized edema: Secondary | ICD-10-CM

## 2014-12-16 DIAGNOSIS — I10 Essential (primary) hypertension: Secondary | ICD-10-CM

## 2014-12-16 NOTE — Patient Instructions (Addendum)
Your physician has requested that you have a lower extremity venous duplex (LOOK FOR INSUFF AND CLOT---DEEP AND SUPERF.). This test is an ultrasound of the veins in the legs. It looks at venous blood flow that carries blood from the heart to the legs. Allow one hour for a Lower Venous exam. There are no restrictions or special instructions.  Your physician wants you to follow-up in Jan 2017 Dr Herbie BaltimoreHARDING- 30 MIN APPOINTMENT. You will receive a reminder letter in the mail two months in advance. If you don't receive a letter, please call our office to schedule the follow-up appointment.

## 2014-12-18 ENCOUNTER — Encounter: Payer: Self-pay | Admitting: Cardiology

## 2014-12-18 DIAGNOSIS — R600 Localized edema: Secondary | ICD-10-CM | POA: Insufficient documentation

## 2014-12-18 DIAGNOSIS — Z86718 Personal history of other venous thrombosis and embolism: Secondary | ICD-10-CM | POA: Insufficient documentation

## 2014-12-18 NOTE — Assessment & Plan Note (Signed)
Unilateral swelling. With a history of DVT in his leg, he clearly probably has some venous stasis issues.  Lower extremity venous Dopplers to evaluate for superficial insufficiency.

## 2014-12-18 NOTE — Assessment & Plan Note (Addendum)
New onset swelling unilateral and in the leg that did have a chronic DVT. 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  Given his history of PE, we will prophylactically check for recurrent lower DVT with a right lower extremity venous Doppler ultrasound as well as evaluating for venous insufficiency.  In the is a DVT, which is related to new support stockings and leg elevation.  We also discussed the importance of walking at a minutes of his long-term driving.  If he were to have recurrent DVT, he would probably need to be on lifelong anticoagulation.

## 2014-12-18 NOTE — Assessment & Plan Note (Signed)
The patient understands the need to lose weight with diet and exercise. We have discussed specific strategies for this.  

## 2014-12-18 NOTE — Progress Notes (Signed)
PCP: Laurena Slimmer, MD  Clinic Note: Chief Complaint  Patient presents with  . 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 As you recall he is a very pleasant gentleman who I was seeing mostly for her history of memory embolism right leg DVT from 04/2013. He was treated with Xarelto for over a year and had some still mild residual thrombus there was chronic noted in the right leg. We stopped the Xarelto switching to aspirin in August 2015.  Past Medical History  Diagnosis Date  . Hypertension   . Hypogonadism male   . DVT (deep venous thrombosis)   . Osteoarthritis of both knees     Prior Cardiac Evaluation and Past Surgical History: Past Surgical History  Procedure Laterality Date  . Knee arthroscopy    . Cardiac catheterization  2006    no blockage  . Carpal tunnel release  2009    Left  . Venous doppler, lower extremity Right 05/18/2013    Chronic appearing DVT with some recanalization involving the femoral vein, posterior tibial and peroneal veins. There does appear to be partial resolution of the common femoral vein and popliteal pain thrombus with residual findings distally.   Interval History: [name] has been doing relatively well up until the last couple weeks when he started noticing worsening swelling in the right leg that has been making it difficult for him to walk.  Actually about 2 weeks ago his left knee started hurting him and was quite swollen.  He actually went to an urgent care when he was out of town and driving instructions and had Dopplers of the left leg showed no thrombus. Shortly after that he started noticing worsening swelling of the right leg it is not tense and somewhat tight, painful. He is concerned he may have a new clot. He denies any other symptoms to suggest PE such  as acute chest tightness, pressure or dyspnea. No tachycardia. No lightheadedness no dizziness no weakness or syncope/near syncope, TIA/amaurosis fugax symptoms.  No chest pain or shortness of breath with rest or exertion. No PND, orthopnea or edema. No palpitations, lightheadedness, dizziness, weakness or syncope/near syncope.  ROS: A comprehensive was performed. Review of Systems  Constitutional: Negative for weight loss.  HENT: Negative for nosebleeds.   Respiratory: Negative for cough, shortness of breath and wheezing.   Cardiovascular: Positive for leg swelling (R >> L, sore; last week L knee was swollen).  Gastrointestinal: Negative for blood in stool and melena.  Genitourinary: Negative for hematuria.  Musculoskeletal: Positive for joint pain (L knee).  Neurological: Negative.   Endo/Heme/Allergies: Negative.   Psychiatric/Behavioral: Negative.   All other systems reviewed and are negative.   Current Outpatient Prescriptions on File Prior to Visit  Medication Sig Dispense Refill  . Multiple Vitamin (MULTIVITAMIN WITH MINERALS) TABS Take 1 tablet by mouth daily.    . nebivolol (BYSTOLIC) 5 MG tablet Take 1 tablet (5 mg total) by mouth every other day. 30 tablet 11   No current facility-administered medications on file prior to visit.   Allergies  Allergen Reactions  . Penicillins Rash    History  Substance Use Topics  . Smoking status: Never Smoker   . Smokeless tobacco: Not on file  . Alcohol Use: No   Family History  Problem  Relation Age of Onset  . Diabetes Mother   . Cancer Mother   . Diabetes Father      Wt Readings from Last 3 Encounters:  12/16/14 230 lb 3.2 oz (104.418 kg)  05/23/14 225 lb 3.2 oz (102.15 kg)  12/04/13 222 lb (100.699 kg)    PHYSICAL EXAM BP 106/64 mmHg  Pulse 53  Ht 5' 4.5" (1.638 m)  Wt 230 lb 3.2 oz (104.418 kg)  BMI 38.92 kg/m2 General appearance: alert, cooperative, appears stated age, Well nourished, well developed, in no  acute distress; Moderately obese. HEENT: Pupils are equal round react to light accommodation extraocular movements are intact.  Neck: No cervical lymphadenopathy.  Cardiac: Regular rate and rhythm with 1/6 systolic murmur, no rubs or gallops.  Lungs: clear to auscultation bilaterally, no wheezing, rhonchi or rales  Abd: soft, nontender, positive bowel sounds all quadrants, no hepatosplenomegaly  Ext: lower extremity edema R> L 2+ & firm. 2+ radial and dorsalis pedis pulses.  Skin: warm and dry  Neuro: Grossly normal; Pleasant mood & affect   Adult ECG Report  Rate: 54 ;  Rhythm: sinus bradycardia w/ PACs, iRBBB  Narrative Interpretation: PACs are new, but otherwise stable ECG  Recent Labs:  n/a   ASSESSMENT / PLAN: Problem List Items Addressed This Visit    DVT (deep venous thrombosis)   Relevant Medications   aspirin EC 81 MG tablet   Edema of right lower extremity - Primary    Unilateral swelling. With a history of DVT in his leg, he clearly probably has some venous stasis issues.  Lower extremity venous Dopplers to evaluate for superficial insufficiency.      Relevant Orders   EKG 12-Lead   Lower Extremity Venous Duplex Bilateral   Essential hypertension (Chronic)   Relevant Medications   aspirin EC 81 MG tablet   History of DVT (deep vein thrombosis) (Chronic)    New onset swelling unilateral and in the leg that did have a chronic DVT. 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  Given his history of PE, we will prophylactically check for recurrent lower DVT with a right lower extremity venous Doppler ultrasound as well as evaluating for venous insufficiency.  In the is a DVT, which is related to new support stockings and leg elevation.  We also discussed the importance of walking at a minutes of his long-term driving.  If he were to have recurrent DVT, he would probably need to be on lifelong anticoagulation.      Relevant Orders   EKG 12-Lead    Lower Extremity Venous Duplex Bilateral   Obesity (BMI 30-39.9) (Chronic)    The patient understands the need to lose weight with diet and exercise. We have discussed specific strategies for this.          Orders Placed This Encounter  Procedures  . EKG 12-Lead  . Lower Extremity Venous Duplex Bilateral    LOOK DEEP AND SUPERIFICIAL    Standing Status: Future     Number of Occurrences:      Standing Expiration Date: 12/16/2015    Order Specific Question:  Laterality    Answer:  Bilateral    Order Specific Question:  Where should this test be performed:    Answer:  [facility]   Meds ordered this encounter  Medications  . naproxen (NAPROSYN) 375 MG tablet    Sig: Take 1 tablet by mouth 3 (three) times daily as needed.  Refill:  0  . aspirin EC 81 MG tablet    Sig: Take 81 mg by mouth daily.  . Probiotic Product (PROBIOTIC DAILY) CAPS    Sig: Take 1 capsule by mouth daily.     Followup: Jan 2017 (unless dopplers are abnormal)    HARDING, Piedad Climes, M.D., M.S. Interventional Cardiologist   Pager # [phone]

## 2014-12-19 ENCOUNTER — Ambulatory Visit (HOSPITAL_COMMUNITY)
Admission: RE | Admit: 2014-12-19 | Discharge: 2014-12-19 | Disposition: A | Discharge status: Home / Self Care | Payer: BLUE CROSS/BLUE SHIELD | Source: Ambulatory Visit | Attending: Cardiology | Admitting: Cardiology

## 2014-12-19 DIAGNOSIS — Z86718 Personal history of other venous thrombosis and embolism: Secondary | ICD-10-CM | POA: Insufficient documentation

## 2014-12-19 DIAGNOSIS — R600 Localized edema: Secondary | ICD-10-CM | POA: Insufficient documentation

## 2014-12-19 NOTE — Progress Notes (Signed)
Bilateral lower extremity venous duplex completed.  Brianna L Mazza,RVT

## 2014-12-23 ENCOUNTER — Telehealth: Payer: Self-pay | Admitting: *Deleted

## 2014-12-23 NOTE — Telephone Encounter (Signed)
Spoke to patient. Result given . Verbalized understanding  

## 2014-12-23 NOTE — Telephone Encounter (Signed)
-----   Message from Marykay Lexavid W Harding, MD sent at 12/20/2014  2:52 PM EDT ----- Does not appear to be a new thrombus in the R leg.   Would continue to wear compression stockings based upon previously documented deep venous insufficiency -- likely related to the h/o DVT.  I do not think that we need to restart Xarelto.  DH

## 2015-03-25 ENCOUNTER — Encounter: Payer: Self-pay | Admitting: Critical Care Medicine

## 2015-06-06 ENCOUNTER — Encounter: Payer: Self-pay | Admitting: Cardiology

## 2015-06-06 ENCOUNTER — Ambulatory Visit (INDEPENDENT_AMBULATORY_CARE_PROVIDER_SITE_OTHER): Payer: BLUE CROSS/BLUE SHIELD | Admitting: Cardiology

## 2015-06-06 VITALS — BP 136/88 | HR 54 | Ht 64.0 in | Wt 222.1 lb

## 2015-06-06 DIAGNOSIS — I82401 Acute embolism and thrombosis of unspecified deep veins of right lower extremity: Secondary | ICD-10-CM | POA: Diagnosis not present

## 2015-06-06 DIAGNOSIS — R600 Localized edema: Secondary | ICD-10-CM

## 2015-06-06 DIAGNOSIS — I2699 Other pulmonary embolism without acute cor pulmonale: Secondary | ICD-10-CM

## 2015-06-06 DIAGNOSIS — I10 Essential (primary) hypertension: Secondary | ICD-10-CM | POA: Diagnosis not present

## 2015-06-06 NOTE — Patient Instructions (Signed)
TAKE BYSTOLIC  1/2 TABLET DAILY ,INSTEAD OF TABLET EVERY OTHER DAY.  IT IS OKAY TO USE CELEBREX IF NEEDED FOR KNEE DISCOMFORT.   IF YOU NEED  CARDIAC CLEARANCE FOR KNEE SURGERY,  CALL OFFICE.   Your physician wants you to follow-up in 12 MONTHS WITH DR HARDING.  You will receive a reminder letter in the mail two months in advance. If you don't receive a letter, please call our office to schedule the follow-up appointment.

## 2015-06-06 NOTE — Progress Notes (Addendum)
PCP: Laurena Slimmer, MD  Clinic Note: Chief Complaint  Patient presents with  . Follow-up    6 months:  No complaints of chest pain, SoB, edema or dizziness.  Would like to know if he can stop wearing compression hose.  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  . DVT    HPI: Craig [name] is a 64 y.o. male with a PMH below who presents today for 6 month f/u -- has h/o DVT with PE, was treated for ~ 1 yr with Xarelto due to LE Venous Dopplers indicating residual RLE chronic DVT findings with venous insufficiency.   Is wearing support hose.  Craig [name] was last seen in March 2016 - 1st post Xarelto visit. Noted increased RLE swelling  Recent Hospitalizations: n/a  Studies Reviewed:   Bilateral Lower Ext. Venous Dopplers 12/19/2014  R PopV, PeroV - partially compressible with chronic thrombus;   LLE - no thrombus or thrmobophlebitis  Interval History: [name] is doing pretty well overall.  He still has R>L LE & wears  Compression hose routinely.  Date his legs old compression hose stockings, he has some swelling above the sock line. Otherwise really has not had any significant bleeding worse edema in the leg. No pain with walking.  He tries to do as much walking as he can on the job and not sitting all the time. He is just upset because he can't wear his work uniform shortness with a black support hose.  Cardiac standpoint, otherwise very stable. Cardiovascular ROS: no chest pain or dyspnea on exertion positive for - edema negative for - dyspnea on exertion, irregular heartbeat, loss of consciousness, orthopnea, palpitations, paroxysmal nocturnal dyspnea, rapid heart rate, shortness of breath or Syncope/near syncope, TIA/amaurosis fugax   Past Medical History  Diagnosis Date  . Hypertension   . Hypogonadism male   . DVT (deep venous thrombosis)   . Osteoarthritis of both knees     ROS: A comprehensive was performed. Review of Systems    Constitutional: Positive for weight loss (has been more active & eating less).  Cardiovascular: Positive for leg swelling. Negative for claudication.  Gastrointestinal: Negative for blood in stool and melena.  Genitourinary: Negative for hematuria.  Musculoskeletal: Positive for joint pain (Bilateral Knee pain - may need surgery).  Neurological: Negative.  Negative for headaches.  Endo/Heme/Allergies: Bruises/bleeds easily.  All other systems reviewed and are negative.   Past Surgical History  Procedure Laterality Date  . Knee arthroscopy    . Cardiac catheterization  2006    no blockage  . Carpal tunnel release  2009    Left  . Venous doppler, lower extremity Right 05/18/2013    Chronic appearing DVT with some recanalization involving the femoral vein, posterior tibial and peroneal veins. There does appear to be partial resolution of the common femoral vein and popliteal pain thrombus with residual findings distally.  . Cardiac catheterization  10/30/2008   Prior to Admission medications   Medication Sig Start Date End Date Taking? Authorizing Provider  aspirin EC 81 MG tablet Take 81 mg by mouth daily.   Yes Historical Provider, MD  Multiple Vitamin (MULTIVITAMIN WITH MINERALS) TABS Take 1 tablet by mouth daily.   Yes Historical Provider, MD  nebivolol (BYSTOLIC) 5 MG tablet Take 1 tablet (5 mg total) by mouth every other day. 05/23/14  Yes Marykay Lex, MD   Allergies  Allergen Reactions  . Penicillins Rash  Social History   Social History  . Marital Status: Married    Spouse Name: N/A  . Number of Children: N/A  . Years of Education: N/A   Social History Main Topics  . Smoking status: Never Smoker   . Smokeless tobacco: None  . Alcohol Use: No  . Drug Use: No  . Sexual Activity: Not Asked   Other Topics Concern  . None   Social History Narrative   He is married truck Hospital doctor. He plays tennis avidly, but is really been bothered by his knees recently. For  that reason has not been out exercising much. He was really to work on his weight loss. Unfortunately of the PE his knees really slowed her progress. He does not drink alcohol and does not smoke.   Family History  Problem Relation Age of Onset  . Cancer Mother   . Diabetes Father   . Diabetes Sister   . Stroke Maternal Grandmother   . Diabetes Paternal Grandmother      Wt Readings from Last 3 Encounters:  06/06/15 222 lb 1.6 oz (100.744 kg)  12/16/14 230 lb 3.2 oz (104.418 kg)  05/23/14 225 lb 3.2 oz (102.15 kg)    PHYSICAL EXAM BP 136/88 mmHg  Pulse 54  Ht  (1.626 m)  Wt 222 lb 1.6 oz (100.744 kg)  BMI 38.10 kg/m2 General appearance: alert, cooperative, appears stated age, Well nourished, well developed, in no acute distress; Moderately obese. HEENT: Pupils are equal round react to light accommodation extraocular movements are intact.  Neck: No cervical lymphadenopathy.  Cardiac: Regular rate and rhythm with 1/6 systolic murmur, no rubs or gallops.  Lungs: clear to auscultation bilaterally, no wheezing, rhonchi or rales  Abd: soft, nontender, positive bowel sounds all quadrants, no hepatosplenomegaly  Ext: lower extremity edema R> L 1+ edema -- support stockings are on (mild swelling above the sock-line). 2+ radial and dorsalis pedis pulses.  Skin: warm and dry  Neuro: Grossly normal; Pleasant mood & affect    Adult ECG Report  Rate: 54 ;  Rhythm: sinus bradycardia and Incomplete RBBB (RSR' in V1);   Narrative Interpretation: otherwise normal axis and intervals and durations.   Other studies Reviewed: Additional studies/ records that were reviewed today include:  Recent Labs:   None available - moniotored by PCP (not in Epic) No results found for: CHOL, HDL, LDLCALC, LDLDIRECT, TRIG, CHOLHDL   ASSESSMENT / PLAN: Problem List Items Addressed This Visit    DVT (deep venous thrombosis)    Chronic right lower extremity. Now no longer on any anticoagulation  other than aspirin. Wearing compression stockings/support hose. Staying active and moving to avoid DVT formation.      Relevant Orders   EKG 12-Lead   Edema of right lower extremity - Primary    Still has unilateral swelling, wearing compression.      Relevant Orders   EKG 12-Lead   Essential hypertension (Chronic)    PCP changed every other day. I referred him to take one half dose every day as opposed to every other day.      Relevant Orders   EKG 12-Lead      Current medicines are reviewed at length with the patient today. (+/- concerns) taking Bystolic 5 mg every other day? His knee pain, asked about using Celebrex The following changes have been made: change to Bystolic 2.5 mg every day. Studies Ordered:   Orders Placed This Encounter  Procedures  . EKG 12-Lead   TAKE BYSTOLIC  1/2 TABLET DAILY ,INSTEAD OF TABLET EVERY OTHER DAY.  IT IS OKAY TO USE CELEBREX IF NEEDED FOR KNEE DISCOMFORT.   IF YOU NEED  CARDIAC CLEARANCE FOR KNEE SURGERY,  CALL OFFICE.   Your physician wants you to follow-up in 12 MONTHS WITH DR Lexey Fletes.    Marykay Lex, M.D., M.S. Interventional Cardiologist   Pager # [phone]    ADDENDUM: Obtained after clinic visit. Recent Labs: 11/2014  Na+ 140, K+ 4.4, Cl- 101, HCO3- 28 , BUN 11, Cr 1.08, Glu 116*, Ca2+ 9.8; AST 17, ALT 16; AlkP 96, Alb 4.0, TP 6.9, T Bili 0.4  CBC: W 4.3, H/H 14.8/P. 43.4, Plt 227  TC 138, TG 79, HDL 47, LDL 75  TSH 1.169, uric acid 6.1, PSA 3.13*

## 2015-06-08 ENCOUNTER — Encounter: Payer: Self-pay | Admitting: Cardiology

## 2015-06-08 NOTE — Assessment & Plan Note (Signed)
PCP changed every other day. I referred him to take one half dose every day as opposed to every other day.

## 2015-06-08 NOTE — Assessment & Plan Note (Signed)
Still has unilateral swelling, wearing compression.

## 2015-06-08 NOTE — Assessment & Plan Note (Signed)
Chronic right lower extremity. Now no longer on any anticoagulation other than aspirin. Wearing compression stockings/support hose. Staying active and moving to avoid DVT formation.

## 2015-06-12 ENCOUNTER — Encounter: Payer: Self-pay | Admitting: Cardiology

## 2015-08-16 ENCOUNTER — Ambulatory Visit (INDEPENDENT_AMBULATORY_CARE_PROVIDER_SITE_OTHER): Payer: BLUE CROSS/BLUE SHIELD | Admitting: Internal Medicine

## 2015-08-16 ENCOUNTER — Ambulatory Visit (INDEPENDENT_AMBULATORY_CARE_PROVIDER_SITE_OTHER): Payer: BLUE CROSS/BLUE SHIELD

## 2015-08-16 VITALS — BP 128/72 | HR 63 | Temp 98.4°F | Resp 18 | Ht 64.5 in | Wt 221.0 lb

## 2015-08-16 DIAGNOSIS — J0100 Acute maxillary sinusitis, unspecified: Secondary | ICD-10-CM

## 2015-08-16 DIAGNOSIS — R059 Cough, unspecified: Secondary | ICD-10-CM

## 2015-08-16 DIAGNOSIS — R05 Cough: Secondary | ICD-10-CM

## 2015-08-16 MED ORDER — HYDROCODONE-HOMATROPINE 5-1.5 MG/5ML PO SYRP: 5.0000 mL | ORAL_SOLUTION | Freq: Four times a day (QID) | ORAL | Status: DC | PRN

## 2015-08-16 MED ORDER — CEFDINIR 300 MG PO CAPS: 300.0000 mg | ORAL_CAPSULE | Freq: Two times a day (BID) | ORAL | Status: DC

## 2015-08-16 NOTE — Progress Notes (Signed)
   Subjective:    Patient ID: Craig [name], male    DOB: 06/17/1951, 64 y.o.   MRN: [id number] This chart was scribed for Craig Siaobert Doolittle, MD by Jolene Provostobert Halas, Medical Scribe. This patient was seen in Room 12 and the patient's care was started a 9:24 AM.  Chief Complaint  Patient presents with  . Cough    3 to 4 days--productive cough--with chest congestion--worse at night--no fever--no n/v    HPI HPI Comments: Craig GuernseyLee A Domingos is a 64 y.o. male who [facility] complaining of a mild productive cough and sinus congestion for the last 3-4 days. He endorses associated scratchy throat, night sweats for the last week, and sleep disturbance. He also states his ear was stopped up last week.    Review of Systems  Constitutional: Negative for fever and chills.  HENT: Positive for congestion and rhinorrhea.   Respiratory: Positive for cough.        Objective:  BP 128/72 mmHg  Pulse 63  Temp(Src) 98.4 F (36.9 C) (Oral)  Resp 18  Ht 5' 4.5" (1.638 m)  Wt 221 lb (100.245 kg)  BMI 37.36 kg/m2  SpO2 97%   Physical Exam  Constitutional: He is oriented to person, place, and time. He appears well-developed and well-nourished. No distress.  HENT:  Head: Normocephalic and atraumatic.  Conjunctiva clear. TMs clear. Nares boggy with purulent discharge. Throat clear. No cervical nodes.  Eyes: Pupils are equal, round, and reactive to light.  Neck: Neck supple.  Cardiovascular: Normal rate.   Pulmonary/Chest: Effort normal. No respiratory distress.  Rales at the right base.  Musculoskeletal: Normal range of motion.  Neurological: He is alert and oriented to person, place, and time. Coordination normal.  Skin: Skin is warm and dry. He is not diaphoretic.  Psychiatric: He has a normal mood and affect. His behavior is normal.  Nursing note and vitals reviewed.  [facility] reading (PRIMARY) by  Dr. Merla Richesoolittle. Chest x-ray: no consolidation or infiltrate and little change since 2013.      Assessment &  Plan:  Cough - Plan: DG Chest 2 View  Acute maxillary sinusitis, recurrence not specified  Meds ordered this encounter  Medications  . cefdinir (OMNICEF) 300 MG capsule    Sig: Take 1 capsule (300 mg total) by mouth 2 (two) times daily.    Dispense:  20 capsule    Refill:  0  . HYDROcodone-homatropine (HYCODAN) 5-1.5 MG/5ML syrup    Sig: Take 5 mLs by mouth every 6 (six) hours as needed.    Dispense:  120 mL    Refill:  0     I have completed the patient encounter in its entirety as documented by the scribe, with editing by me where necessary. Robert P. Merla Richesoolittle, M.D.   By signing my name below, I, Javier Dockerobert Ryan Halas, attest that this documentation has been prepared under the direction and in the presence of Craig Siaobert Doolittle, MD. Electronically Signed: Javier Dockerobert Ryan Halas, ER Scribe. 08/16/2015. 9:25 AM.

## 2015-09-02 ENCOUNTER — Emergency Department (HOSPITAL_COMMUNITY)
Admission: EM | Admit: 2015-09-02 | Discharge: 2015-09-03 | Disposition: A | Discharge status: Home / Self Care | Payer: BLUE CROSS/BLUE SHIELD | Attending: Emergency Medicine | Admitting: Emergency Medicine

## 2015-09-02 ENCOUNTER — Telehealth: Payer: Self-pay | Admitting: Family Medicine

## 2015-09-02 ENCOUNTER — Encounter (HOSPITAL_COMMUNITY): Payer: Self-pay | Admitting: Emergency Medicine

## 2015-09-02 DIAGNOSIS — Z791 Long term (current) use of non-steroidal anti-inflammatories (NSAID): Secondary | ICD-10-CM | POA: Diagnosis not present

## 2015-09-02 DIAGNOSIS — Z7982 Long term (current) use of aspirin: Secondary | ICD-10-CM | POA: Diagnosis not present

## 2015-09-02 DIAGNOSIS — M170 Bilateral primary osteoarthritis of knee: Secondary | ICD-10-CM | POA: Insufficient documentation

## 2015-09-02 DIAGNOSIS — Z79899 Other long term (current) drug therapy: Secondary | ICD-10-CM | POA: Insufficient documentation

## 2015-09-02 DIAGNOSIS — R319 Hematuria, unspecified: Secondary | ICD-10-CM | POA: Diagnosis present

## 2015-09-02 DIAGNOSIS — Z86718 Personal history of other venous thrombosis and embolism: Secondary | ICD-10-CM | POA: Diagnosis not present

## 2015-09-02 DIAGNOSIS — Z880 Allergy status to penicillin: Secondary | ICD-10-CM | POA: Insufficient documentation

## 2015-09-02 DIAGNOSIS — N390 Urinary tract infection, site not specified: Secondary | ICD-10-CM | POA: Insufficient documentation

## 2015-09-02 DIAGNOSIS — I10 Essential (primary) hypertension: Secondary | ICD-10-CM | POA: Insufficient documentation

## 2015-09-02 DIAGNOSIS — Z87438 Personal history of other diseases of male genital organs: Secondary | ICD-10-CM | POA: Diagnosis not present

## 2015-09-02 LAB — CBC
HCT: 42.7 % (ref 39.0–52.0)
Hemoglobin: 14.3 g/dL (ref 13.0–17.0)
MCH: 29.2 pg (ref 26.0–34.0)
MCHC: 33.5 g/dL (ref 30.0–36.0)
MCV: 87.1 fL (ref 78.0–100.0)
PLATELETS: 213 10*3/uL (ref 150–400)
RBC: 4.9 MIL/uL (ref 4.22–5.81)
RDW: 13.3 % (ref 11.5–15.5)
WBC: 5.8 10*3/uL (ref 4.0–10.5)

## 2015-09-02 LAB — BASIC METABOLIC PANEL
Anion gap: 7 (ref 5–15)
BUN: 10 mg/dL (ref 6–20)
CALCIUM: 9.5 mg/dL (ref 8.9–10.3)
CO2: 27 mmol/L (ref 22–32)
CREATININE: 1.09 mg/dL (ref 0.61–1.24)
Chloride: 103 mmol/L (ref 101–111)
GFR calc Af Amer: >60 mL/min (ref >60)
GFR calc non Af Amer: >60 mL/min (ref >60)
GLUCOSE: 116 mg/dL — AB (ref 65–99)
Potassium: 4 mmol/L (ref 3.5–5.1)
Sodium: 137 mmol/L (ref 135–145)

## 2015-09-02 LAB — URINALYSIS, ROUTINE W REFLEX MICROSCOPIC
BILIRUBIN URINE: NEGATIVE
GLUCOSE, UA: NEGATIVE mg/dL
KETONES UR: NEGATIVE mg/dL
NITRITE: NEGATIVE
PH: 7.5 (ref 5.0–8.0)
Protein, ur: NEGATIVE mg/dL
Specific Gravity, Urine: 1.01 (ref 1.005–1.030)

## 2015-09-02 LAB — URINE MICROSCOPIC-ADD ON

## 2015-09-02 NOTE — Telephone Encounter (Signed)
Call from answering service.  Patient c/o small clots in urine 1 hour ago.  Has been urinating more frequently since got home.  Took 1 Cialis.  Slightly discolored urine.  Feels weak, feels like he is febrile tonight. On ASA with hx of DVT.   Advised ER eval tonight. his wife will drive him.

## 2015-09-02 NOTE — ED Notes (Signed)
Pt reports urinary frequency and hematuria. Said last time this happened he had a UTI. Endorses intermittent chills. Denies flank pain/N/V/D. No other c/c. Took Cialis this evening. Noticed hematuria after taking this. Concerned this is related.

## 2015-09-03 MED ORDER — LIDOCAINE HCL 1 % IJ SOLN
INTRAMUSCULAR | Status: AC
  Filled 2015-09-03: qty 20

## 2015-09-03 MED ORDER — PHENAZOPYRIDINE HCL 200 MG PO TABS: 200.0000 mg | ORAL_TABLET | Freq: Three times a day (TID) | ORAL | Status: DC | PRN

## 2015-09-03 MED ORDER — CEPHALEXIN 500 MG PO CAPS
500.0000 mg | ORAL_CAPSULE | Freq: Four times a day (QID) | ORAL | Status: DC
Start: 2015-09-03 — End: 2015-10-13

## 2015-09-03 MED ORDER — CEFTRIAXONE SODIUM 1 G IJ SOLR
1.0000 g | Freq: Once | INTRAMUSCULAR | Status: AC
  Administered 2015-09-03: 1 g via INTRAMUSCULAR
  Filled 2015-09-03: qty 10

## 2015-09-03 NOTE — ED Provider Notes (Signed)
By signing my name below, I, Budd PalmerVanessa Prueter, attest that this documentation has been prepared under the direction and in the presence of Enbridge EnergyKristen N Dierdre Mccalip, DO. Electronically Signed: Budd PalmerVanessa Prueter, ED Scribe. 09/03/2015. 1:05 AM.  TIME SEEN: 1:01 AM  CHIEF COMPLAINT: Urinary Frequency, Hematuria  HPI: Craig [name] is a 64 y.o. male with a PMHx of HTN, DVT no longer on anticoagulation who presents to the Emergency Department complaining of urinary frequency and hematuria onset earlier today. He notes the urine has grown progressively less bloody since onset. He notes passing several clots. He reports associated chills. He notes he may have also pulled a muscle in his thigh. No back pain, flank pain. He reports a PMHx of UTI. He notes he used to take Xarelto for a PMHx of DVT, but states he has been off of it for the past year. He denies a PMHx of kidney stones. Pt denies fever, n/v/d, penile discharge, pain or swelling in the testicles. States he has had some dysuria. Reports both dysuria and hematuria have improved since being in the emergency department. Pt is allergic to penicillins (rash).   ROS: See HPI Constitutional: no fever  Eyes: no drainage  ENT: no runny nose   Cardiovascular:  no chest pain  Resp: no SOB  GI: no vomiting GU: no dysuria Integumentary: no rash  Allergy: no hives  Musculoskeletal: no leg swelling  Neurological: no slurred speech ROS otherwise negative  PAST MEDICAL HISTORY/PAST SURGICAL HISTORY:  Past Medical History  Diagnosis Date  . Hypertension   . Hypogonadism male   . DVT (deep venous thrombosis) (HCC)   . Osteoarthritis of both knees     MEDICATIONS:  Prior to Admission medications   Medication Sig Start Date End Date Taking? Authorizing Provider  aspirin EC 81 MG tablet Take 81 mg by mouth daily.   Yes Historical Provider, MD  celecoxib (CELEBREX) 200 MG capsule Take 200 mg by mouth daily as needed for mild pain.   Yes Historical Provider, MD   diclofenac sodium (VOLTAREN) 1 % GEL Apply 2 g topically 4 (four) times daily.   Yes Historical Provider, MD  Multiple Vitamin (MULTIVITAMIN WITH MINERALS) TABS Take 1 tablet by mouth daily.   Yes Historical Provider, MD  nebivolol (BYSTOLIC) 5 MG tablet Take 1 tablet (5 mg total) by mouth every other day. Patient taking differently: Take 5 mg by mouth daily.  05/23/14  Yes Marykay Lexavid W Harding, MD  tadalafil (CIALIS) 20 MG tablet Take 20 mg by mouth daily as needed (frequency).   Yes Historical Provider, MD  vitamin C (ASCORBIC ACID) 500 MG tablet Take 500 mg by mouth daily.   Yes Historical Provider, MD  cefdinir (OMNICEF) 300 MG capsule Take 1 capsule (300 mg total) by mouth 2 (two) times daily. Patient not taking: Reported on 09/03/2015 08/16/15   Tonye Pearsonobert P Doolittle, MD  HYDROcodone-homatropine [facility](HYCODAN) 5-1.5 MG/5ML syrup Take 5 mLs by mouth every 6 (six) hours as needed. Patient not taking: Reported on 09/03/2015 08/16/15   Tonye Pearsonobert P Doolittle, MD    ALLERGIES:  Allergies  Allergen Reactions  . Penicillins Rash    Has patient had a PCN reaction causing immediate rash, facial/tongue/throat swelling, SOB or lightheadedness with hypotension: NoNo Has patient had a PCN reaction causing severe rash involving mucus membranes or skin necrosis: NoNo Has patient had a PCN reaction that required hospitalization No No Has patient had a PCN reaction occurring within the last 10 years: NoNo If all of the  above answers are "NO", then may proceed with Cephalosporin    SOCIAL HISTORY:  Social History  Substance Use Topics  . Smoking status: Never Smoker   . Smokeless tobacco: Not on file  . Alcohol Use: No    FAMILY HISTORY: Family History  Problem Relation Age of Onset  . Cancer Mother   . Diabetes Father   . Diabetes Sister   . Stroke Maternal Grandmother   . Diabetes Paternal Grandmother     EXAM: BP 178/75 mmHg  Pulse 69  Temp(Src) 99 F (37.2 C) (Oral)  Resp 16  SpO2  100% CONSTITUTIONAL: Alert and oriented and responds appropriately to questions. Well-appearing; well-nourished HEAD: Normocephalic EYES: Conjunctivae clear, PERRL ENT: normal nose; no rhinorrhea; moist mucous membranes; pharynx without lesions noted NECK: Supple, no meningismus, no LAD  CARD: RRR; S1 and S2 appreciated; no murmurs, no clicks, no rubs, no gallops RESP: Normal chest excursion without splinting or tachypnea; breath sounds clear and equal bilaterally; no wheezes, no rhonchi, no rales, no hypoxia or respiratory distress, speaking full sentences ABD/GI: Normal bowel sounds; non-distended; soft, non-tender, no rebound, no guarding, no peritoneal signs BACK:  The back appears normal and is non-tender to palpation, there is no CVA tenderness EXT: Normal ROM in all joints; non-tender to palpation; no edema; normal capillary refill; no cyanosis, no calf tenderness or swelling    SKIN: Normal color for age and race; warm NEURO: Moves all extremities equally, sensation to light touch intact diffusely, cranial nerves II through XII intact, normal gait PSYCH: The patient's mood and manner are appropriate. Grooming and personal hygiene are appropriate.  MEDICAL DECISION MAKING: Patient here with complaints of dysuria, hematuria that is slowly improving. No flank pain on exam. No history of kidney stones. Denies any pain currently. No fever, vomiting or diarrhea. Abdominal exam benign. No CVA tenderness on exam. Patient does appear to have urinary tract infection. Culture is pending. No previous cultures to compare. Will treat with IM ceftriaxone the emergency department and discharge on oral Keflex. Discussed return precautions. Recommend outpatient follow-up with his primary care provider. He verbalized understanding and is comfortable with this plan.  I personally performed the services described in this documentation, which was scribed in my presence. The recorded information has been reviewed  and is accurate.    Layla Maw Korinna Tat, DO 09/03/15 [phone]

## 2015-09-03 NOTE — Discharge Instructions (Signed)

## 2015-09-05 LAB — URINE CULTURE: Culture: >=100000

## 2015-09-07 ENCOUNTER — Telehealth (HOSPITAL_BASED_OUTPATIENT_CLINIC_OR_DEPARTMENT_OTHER): Payer: Self-pay | Admitting: Emergency Medicine

## 2015-09-07 NOTE — Telephone Encounter (Signed)
Post ED Visit - Positive Culture Follow-up  Culture report reviewed by antimicrobial stewardship pharmacist:  []  Enzo BiNathan Batchelder, Pharm.D. []  Celedonio MiyamotoJeremy Frens, Pharm.D., BCPS []  Garvin FilaMike Maccia, Pharm.D. []  Georgina PillionElizabeth Martin, Pharm.D., BCPS []  [place] CityMinh Pham, [address]Ph[address]D., BCPS, AAHIVP []  Estella HuskMichelle Turner, Pharm.D., BCPS, AAHIVP [x]  Tennis Mustassie Stewart, [address]Pharm[address] []  Rob Oswaldo DoneVincent, [address]  Positive urine culture E. coli Treated with cephalexin, organism sensitive to the same and no further patient follow-up is required at this time.  Berle MullMiller, Dolph Tavano 09/07/2015, 9:53 AM

## 2015-10-13 ENCOUNTER — Ambulatory Visit (INDEPENDENT_AMBULATORY_CARE_PROVIDER_SITE_OTHER): Payer: BLUE CROSS/BLUE SHIELD | Admitting: Physician Assistant

## 2015-10-13 VITALS — BP 110/68 | HR 66 | Temp 98.2°F | Resp 18 | Ht 64.5 in | Wt 218.0 lb

## 2015-10-13 DIAGNOSIS — R059 Cough, unspecified: Secondary | ICD-10-CM

## 2015-10-13 DIAGNOSIS — R05 Cough: Secondary | ICD-10-CM

## 2015-10-13 DIAGNOSIS — R5383 Other fatigue: Secondary | ICD-10-CM | POA: Diagnosis not present

## 2015-10-13 DIAGNOSIS — G9331 Postviral fatigue syndrome: Secondary | ICD-10-CM

## 2015-10-13 DIAGNOSIS — G933 Postviral fatigue syndrome: Secondary | ICD-10-CM

## 2015-10-13 MED ORDER — ALBUTEROL SULFATE HFA 108 (90 BASE) MCG/ACT IN AERS: 2.0000 | INHALATION_SPRAY | RESPIRATORY_TRACT | Status: DC | PRN

## 2015-10-13 NOTE — Patient Instructions (Signed)
Drink plenty of water (64 oz/day) and get plenty of rest. Continue mucinex Use albuterol inhaler every 4 hours as needed for wheezing, chest tightness, coughing fits. If your symptoms are not improving in 1 week, give me a phone and will determine if you need anything different

## 2015-10-13 NOTE — Progress Notes (Signed)
[facility] [address], [place] [address] [phone]  Date:  10/13/2015   Name:  Carolin GuernseyLee A Russett   DOB:  12/21/1950   MRN:  [id number]  PCP:  Laurena SlimmerLARK,PRESTON S, MD    Chief Complaint: chest congestion and Wheezing   History of Present Illness:  This is a 65 y.o. male with PMH DVT, PE, HTN who is presenting with chest congestion, cough and wheezing x 1 week. Cough is mostly dry. Had nasal congestion initially but this has resolved. Hearing wheezing when he breathes, no sob. Denies sore throat, otalgia, fever, chills. Feels a little tired, otherwise feels ok. Overall, feels his cough is getting a little better. He has noticed he isn't coughing as frequently. He is sleeping ok, sleep not disturbed by coughing. States he is here because he wife was worried about his wheezing. Been trying mucinex and helping some. No hx allergies or asthma. Not a smoker. Does tend to have some wheezing with chest colds.  Review of Systems:  Review of Systems See HPI  Patient Active Problem List   Diagnosis Date Noted  . History of DVT (deep vein thrombosis) 12/18/2014  . Edema of right lower extremity 12/18/2014  . Obesity (BMI 30-39.9) 06/24/2013  . Pulmonary embolism (HCC) 01/23/2013  . DVT (deep venous thrombosis) (HCC) 01/23/2013  . Essential hypertension     Prior to Admission medications   Medication Sig Start Date End Date Taking? Authorizing Provider  aspirin EC 81 MG tablet Take 81 mg by mouth daily.   Yes Historical Provider, MD  celecoxib (CELEBREX) 200 MG capsule Take 200 mg by mouth daily as needed for mild pain.   Yes Historical Provider, MD  diclofenac sodium (VOLTAREN) 1 % GEL Apply 2 g topically 4 (four) times daily.   Yes Historical Provider, MD  Multiple Vitamin (MULTIVITAMIN WITH MINERALS) TABS Take 1 tablet by mouth daily.   Yes Historical Provider, MD  nebivolol (BYSTOLIC) 5 MG tablet Take 1 tablet (5 mg total) by mouth every other day. Patient taking  differently: Take 5 mg by mouth daily.  05/23/14  Yes Marykay Lexavid W Harding, MD  vitamin C (ASCORBIC ACID) 500 MG tablet Take 500 mg by mouth daily.   Yes Historical Provider, MD  tadalafil (CIALIS) 20 MG tablet Take 20 mg by mouth daily as needed (frequency). Reported on 10/13/2015    Historical Provider, MD    Allergies  Allergen Reactions  . Penicillins Rash    Past Surgical History  Procedure Laterality Date  . Knee arthroscopy    . Cardiac catheterization  2006    no blockage  . Carpal tunnel release  2009    Left  . Venous doppler, lower extremity Right 05/18/2013    Chronic appearing DVT with some recanalization involving the femoral vein, posterior tibial and peroneal veins. There does appear to be partial resolution of the common femoral vein and popliteal pain thrombus with residual findings distally.  . Cardiac catheterization  10/30/2008    Social History  Substance Use Topics  . Smoking status: Never Smoker   . Smokeless tobacco: None  . Alcohol Use: No    Family History  Problem Relation Age of Onset  . Cancer Mother   . Diabetes Father   . Diabetes Sister   . Stroke Maternal Grandmother   . Diabetes Paternal Grandmother     Medication list has been reviewed and updated.  Physical Examination:  Physical Exam  Constitutional: He is oriented to  person, place, and time. He appears well-developed and well-nourished. No distress.  HENT:  Head: Normocephalic and atraumatic.  Right Ear: Hearing, tympanic membrane, external ear and ear canal normal.  Left Ear: Hearing, tympanic membrane, external ear and ear canal normal.  Nose: Mucosal edema present.  Mouth/Throat: Uvula is midline and mucous membranes are normal. Posterior oropharyngeal erythema present. No oropharyngeal exudate or posterior oropharyngeal edema.  Eyes: Conjunctivae and lids are normal. Right eye exhibits no discharge. Left eye exhibits no discharge. No scleral icterus.  Cardiovascular: Normal rate,  regular rhythm, normal heart sounds and normal pulses.   No murmur heard. Pulmonary/Chest: Effort normal and breath sounds normal. No respiratory distress. He has no wheezes. He has no rhonchi. He has no rales.  Musculoskeletal: Normal range of motion.  Lymphadenopathy:       Head (right side): No submental, no submandibular and no tonsillar adenopathy present.       Head (left side): No submental, no submandibular and no tonsillar adenopathy present.    He has no cervical adenopathy.  Neurological: He is alert and oriented to person, place, and time.  Skin: Skin is warm, dry and intact. No lesion and no rash noted.  Psychiatric: He has a normal mood and affect. His speech is normal and behavior is normal. Thought content normal.    BP 110/68 mmHg  Pulse 66  Temp(Src) 98.2 F (36.8 C) (Oral)  Resp 18  Ht 5' 4.5" (1.638 m)  Wt 218 lb (98.884 kg)  BMI 36.86 kg/m2  SpO2 98%  Assessment and Plan:  1. Postviral syndrome 2. Cough Cough is overall improving. Lung exam and vitals benign. He will continue home mucinex. Add albuterol prn wheezing. Counseled on rest and hydration. Call in 1 week if not getting better and let me know. - albuterol (PROVENTIL HFA;VENTOLIN HFA) 108 (90 Base) MCG/ACT inhaler; Inhale 2 puffs into the lungs every 4 (four) hours as needed for wheezing or shortness of breath (cough, shortness of breath or wheezing.).  Dispense: 1 Inhaler; Refill: 1   Nicole V. Dyke Brackett, MHS [facility]  10/13/2015

## 2015-11-20 ENCOUNTER — Ambulatory Visit (INDEPENDENT_AMBULATORY_CARE_PROVIDER_SITE_OTHER): Payer: BLUE CROSS/BLUE SHIELD | Admitting: Physician Assistant

## 2015-11-20 VITALS — BP 128/64 | HR 61 | Temp 98.8°F | Resp 16 | Ht 64.0 in | Wt 228.0 lb

## 2015-11-20 DIAGNOSIS — R319 Hematuria, unspecified: Secondary | ICD-10-CM

## 2015-11-20 DIAGNOSIS — N390 Urinary tract infection, site not specified: Secondary | ICD-10-CM

## 2015-11-20 DIAGNOSIS — R300 Dysuria: Secondary | ICD-10-CM

## 2015-11-20 LAB — POCT URINALYSIS DIP (MANUAL ENTRY)
BILIRUBIN UA: NEGATIVE
BILIRUBIN UA: NEGATIVE
Glucose, UA: NEGATIVE
NITRITE UA: NEGATIVE
Protein Ur, POC: NEGATIVE
Spec Grav, UA: 1.015
Urobilinogen, UA: 0.2
pH, UA: 7

## 2015-11-20 LAB — POC MICROSCOPIC URINALYSIS (UMFC): MUCUS RE: ABSENT

## 2015-11-20 MED ORDER — CIPROFLOXACIN HCL 500 MG PO TABS: 500.0000 mg | ORAL_TABLET | Freq: Two times a day (BID) | ORAL | Status: DC

## 2015-11-20 NOTE — Progress Notes (Signed)
Craig [name]  MRN: [id number] DOB: [birth year]  Subjective:  Pt presents to clinic with concerns that he has urinary infection.   He had a UTI in 08/2015 and was treated at the hospital.  He started with dysuria and hematuria and frequency increase.  He has no fevers or chills and no back pain.  He is having some testicular pain on the left side.  He has had no change in sexual partners.  Patient Active Problem List   Diagnosis Date Noted  . History of DVT (deep vein thrombosis) 12/18/2014  . Edema of right lower extremity 12/18/2014  . Obesity (BMI 30-39.9) 06/24/2013  . Pulmonary embolism (HCC) 01/23/2013  . DVT (deep venous thrombosis) (HCC) 01/23/2013  . Essential hypertension     Current Outpatient Prescriptions on File Prior to Visit  Medication Sig Dispense Refill  . aspirin EC 81 MG tablet Take 81 mg by mouth daily.    . celecoxib (CELEBREX) 200 MG capsule Take 200 mg by mouth daily as needed for mild pain.    Marland Kitchen diclofenac sodium (VOLTAREN) 1 % GEL Apply 2 g topically 4 (four) times daily.    . Multiple Vitamin (MULTIVITAMIN WITH MINERALS) TABS Take 1 tablet by mouth daily.    . nebivolol (BYSTOLIC) 5 MG tablet Take 1 tablet (5 mg total) by mouth every other day. (Patient taking differently: Take 5 mg by mouth daily. ) 30 tablet 11  . albuterol (PROVENTIL HFA;VENTOLIN HFA) 108 (90 Base) MCG/ACT inhaler Inhale 2 puffs into the lungs every 4 (four) hours as needed for wheezing or shortness of breath (cough, shortness of breath or wheezing.). (Patient not taking: Reported on 11/20/2015) 1 Inhaler 1  . tadalafil (CIALIS) 20 MG tablet Take 20 mg by mouth daily as needed (frequency). Reported on 11/20/2015    . vitamin C (ASCORBIC ACID) 500 MG tablet Take 500 mg by mouth daily. Reported on 11/20/2015     No current facility-administered medications on file prior to visit.    Allergies  Allergen Reactions  . Penicillins Rash    Has patient had a PCN reaction causing immediate rash,  facial/tongue/throat swelling, SOB or lightheadedness with hypotension: NoNo Has patient had a PCN reaction causing severe rash involving mucus membranes or skin necrosis: NoNo Has patient had a PCN reaction that required hospitalization No No Has patient had a PCN reaction occurring within the last 10 years: NoNo If all of the above answers are "NO", then may proceed with Cephalosporin    Review of Systems  Constitutional: Negative for fever and chills.  Gastrointestinal: Negative for abdominal pain.  Genitourinary: Positive for dysuria, urgency, frequency, hematuria and testicular pain.   Objective:  BP 128/64 mmHg  Pulse 61  Temp(Src) 98.8 F (37.1 C)  Resp 16  Ht  (1.626 m)  Wt 228 lb (103.42 kg)  BMI 39.12 kg/m2  SpO2 95%  Physical Exam  Constitutional: He is oriented to person, place, and time and well-developed, well-nourished, and in no distress.  HENT:  Head: Normocephalic and atraumatic.  Right Ear: External ear normal.  Left Ear: External ear normal.  Eyes: Conjunctivae are normal.  Neck: Normal range of motion.  Cardiovascular: Normal rate, regular rhythm and normal heart sounds.   No murmur heard. Pulmonary/Chest: Effort normal and breath sounds normal. He has no wheezes.  Abdominal: Soft. Bowel sounds are normal. There is no tenderness. There is no CVA tenderness.  Genitourinary: Penis normal. He exhibits epididymal tenderness (left side also swollen).  Neurological: He is alert and oriented to person, place, and time. Gait normal.  Skin: Skin is warm and dry.  Psychiatric: Mood, memory, affect and judgment normal.     Results for orders placed or performed in visit on 11/20/15  POCT urinalysis dipstick  Result Value Ref Range   Color, UA yellow yellow   Clarity, UA cloudy (A) clear   Glucose, UA negative negative   Bilirubin, UA negative negative   Ketones, POC UA negative negative   Spec Grav, UA 1.015    Blood, UA large (A) negative   pH, UA  7.0    Protein Ur, POC negative negative   Urobilinogen, UA 0.2    Nitrite, UA Negative Negative   Leukocytes, UA moderate (2+) (A) Negative  POCT Microscopic Urinalysis ([facility])  Result Value Ref Range   WBC,UR,HPF,POC Too numerous to count  (A) None WBC/hpf   RBC,UR,HPF,POC Too numerous to count  (A) None RBC/hpf   Bacteria Moderate (A) None, Too numerous to count   Mucus Absent Absent   Epithelial Cells, UR Per Microscopy Few (A) None, Too numerous to count cells/hpf     Assessment and Plan :  Dysuria - Plan: POCT urinalysis dipstick, POCT Microscopic Urinalysis ([facility]), GC/Chlamydia Probe Amp, ciprofloxacin (CIPRO) 500 MG tablet, Urine culture  Hematuria - Plan: POCT urinalysis dipstick, POCT Microscopic Urinalysis ([facility])   This is unusual for a male to have 2 UTIs - we will check a urine culture and uriprobe - we will also do a referral to urology.  Benny Lennert PA-C  [facility] 11/21/2015 10:12 AM

## 2015-11-22 LAB — GC/CHLAMYDIA PROBE AMP
CT PROBE, AMP APTIMA: NOT DETECTED
GC Probe RNA: NOT DETECTED

## 2015-11-24 ENCOUNTER — Telehealth: Payer: Self-pay

## 2015-11-24 LAB — URINE CULTURE: Colony Count: 10000

## 2015-11-24 NOTE — Telephone Encounter (Signed)
Assessment and Plan :  Dysuria - Plan: POCT urinalysis dipstick, POCT Microscopic Urinalysis ([facility]), GC/Chlamydia Probe Amp, ciprofloxacin (CIPRO) 500 MG tablet, Urine culture  Hematuria - Plan: POCT urinalysis dipstick, POCT Microscopic Urinalysis ([facility])   This is unusual for a male to have 2 UTIs - we will check a urine culture and uriprobe - we will also do a referral to urology.

## 2015-11-24 NOTE — Telephone Encounter (Signed)
Pt states he had seen Maralyn Sago regarding an infection and it hasn't gone away, didn't know how long should he wait or is there something else he can take. Please call [phone]    [facility] [address]

## 2015-11-25 NOTE — Telephone Encounter (Signed)
He should finish the abx - the referral is in process for the urologist - if he is not getting better by the end of the abx please let me know and I will send him in some more.  He is on the correct abx for his infection.

## 2015-11-25 NOTE — Addendum Note (Signed)
Addended by: Morrell Riddle on: 11/25/2015 08:21 AM   Modules accepted: Kipp Brood

## 2015-11-25 NOTE — Telephone Encounter (Signed)
I did not find anything that is why I want him to see a urologist.

## 2015-11-25 NOTE — Telephone Encounter (Signed)
Pt should be better.

## 2015-11-25 NOTE — Telephone Encounter (Signed)
Spoke with pt, advised message. He wanted to know if you researched why he would be getting these UTI?

## 2015-11-26 NOTE — Telephone Encounter (Signed)
Advised pt

## 2016-03-16 ENCOUNTER — Other Ambulatory Visit: Payer: Self-pay | Admitting: Internal Medicine

## 2016-03-16 DIAGNOSIS — M79662 Pain in left lower leg: Secondary | ICD-10-CM

## 2016-03-16 DIAGNOSIS — M7989 Other specified soft tissue disorders: Principal | ICD-10-CM

## 2016-03-22 ENCOUNTER — Ambulatory Visit (HOSPITAL_COMMUNITY)
Admission: RE | Admit: 2016-03-22 | Discharge: 2016-03-22 | Disposition: A | Discharge status: Home / Self Care | Payer: BLUE CROSS/BLUE SHIELD | Source: Ambulatory Visit | Attending: Internal Medicine | Admitting: Internal Medicine

## 2016-03-22 DIAGNOSIS — M79605 Pain in left leg: Secondary | ICD-10-CM | POA: Diagnosis not present

## 2016-03-22 DIAGNOSIS — M7989 Other specified soft tissue disorders: Secondary | ICD-10-CM

## 2016-03-22 DIAGNOSIS — M79662 Pain in left lower leg: Secondary | ICD-10-CM | POA: Diagnosis present

## 2016-03-22 DIAGNOSIS — Z86718 Personal history of other venous thrombosis and embolism: Secondary | ICD-10-CM | POA: Diagnosis not present

## 2016-03-22 NOTE — Progress Notes (Signed)
VASCULAR LAB PRELIMINARY  PRELIMINARY  PRELIMINARY  PRELIMINARY  Left lower extremity venous duplex completed.    Preliminary report:  Left:  No evidence of DVT, superficial thrombosis, or Baker's cyst.  Taziah Difatta, RVS 03/22/2016, 1:37 PM

## 2016-07-14 ENCOUNTER — Ambulatory Visit (INDEPENDENT_AMBULATORY_CARE_PROVIDER_SITE_OTHER): Payer: BLUE CROSS/BLUE SHIELD | Admitting: Cardiology

## 2016-07-14 ENCOUNTER — Encounter: Payer: Self-pay | Admitting: Cardiology

## 2016-07-14 VITALS — BP 110/80 | HR 51 | Ht 64.0 in | Wt 223.0 lb

## 2016-07-14 DIAGNOSIS — Z86711 Personal history of pulmonary embolism: Secondary | ICD-10-CM | POA: Diagnosis not present

## 2016-07-14 DIAGNOSIS — I10 Essential (primary) hypertension: Secondary | ICD-10-CM | POA: Diagnosis not present

## 2016-07-14 DIAGNOSIS — E669 Obesity, unspecified: Secondary | ICD-10-CM

## 2016-07-14 DIAGNOSIS — R600 Localized edema: Secondary | ICD-10-CM | POA: Diagnosis not present

## 2016-07-14 DIAGNOSIS — I82411 Acute embolism and thrombosis of right femoral vein: Secondary | ICD-10-CM

## 2016-07-14 MED ORDER — FUROSEMIDE 20 MG PO TABS: 20.0000 mg | ORAL_TABLET | Freq: Every day | ORAL | 3 refills | Status: DC | PRN

## 2016-07-14 NOTE — Patient Instructions (Signed)
MAY USE FUROSEMIDE 20 MG DAILY FOR SWELLING AS NEEDED  NO OTHER CHANGES   Your physician wants you to follow-up in: 12 MONTHS WITH DR HARDING. You will receive a reminder letter in the mail two months in advance. If you don't receive a letter, please call our office to schedule the follow-up appointment.   If you need a refill on your cardiac medications before your next appointment, please call your pharmacy.

## 2016-07-14 NOTE — Progress Notes (Signed)
PCP: Craig SlimmerLARK,Craig S, MD  Clinic Note: Chief Complaint  Patient presents with  . Follow-up    12 MONTHS  . Edema    RIGHT LEG AT TIMES.    HPI: Craig [name] is a [age] y.o. male with a PMH below who presents today for 6 month f/u -- has h/o DVT with PE, was treated for ~ 1 yr with Xarelto due to LE Venous Dopplers indicating residual RLE chronic DVT findings with venous insufficiency.   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  Craig [name] was last seen in March 2016 - 1st post Xarelto visit. Noted increased RLE swelling  Recent Hospitalizations: n/a  Studies Reviewed:   Bilateral Lower Ext. Venous Dopplers June 2017  No evidence of deep or superficial vein thrombosis of left lower extremity and right common vein.  Interval History: Craig HaiLee is doing pretty well overall.  He still has R>L LE & wears  Compression hose routinely. On the days that he does not wear his compression hose stockings, he has some swelling above the sock line. Otherwise really has not had any significant bleeding. No worsening of the edema in the right leg.Marland Kitchen. No pain with walking.  He tries to do as much walking as he can on the job and not sitting all the time. He is now able to wear his work shoes.  Cardiac standpoint, otherwise very stable. Cardiovascular Review of Symptoms: no chest pain or dyspnea on exertion positive for - edema negative for - dyspnea on exertion, irregular heartbeat, loss of consciousness, orthopnea, palpitations, paroxysmal nocturnal dyspnea, rapid heart rate, shortness of breath or Syncope/near syncope, TIA/amaurosis fugax   Past Medical History:  Diagnosis Date  . DVT (deep venous thrombosis) (HCC)   . Hypertension   . Hypogonadism male   . Osteoarthritis of both knees     ROS: A comprehensive was performed. Review of Systems  Constitutional: Positive for weight loss (has been more active & eating less).  HENT: Negative for nosebleeds.   Eyes: Negative.     Respiratory: Negative for shortness of breath.   Cardiovascular: Positive for leg swelling (Pretty well-controlled when he wears his support stockings.). Negative for claudication.  Gastrointestinal: Negative for blood in stool and melena.  Genitourinary: Negative for hematuria.  Musculoskeletal: Positive for joint pain (Bilateral Knee pain - may need surgery).  Skin: Negative.   Neurological: Negative.  Negative for headaches.  Psychiatric/Behavioral: Negative.   All other systems reviewed and are negative.   Past Surgical History:  Procedure Laterality Date  . CARDIAC CATHETERIZATION  2006   no blockage  . CARDIAC CATHETERIZATION  10/30/2008  . CARPAL TUNNEL RELEASE  2009   Left  . KNEE ARTHROSCOPY    . Venous Doppler, lower extremity Right 05/18/2013   Chronic appearing DVT with some recanalization involving the femoral vein, posterior tibial and peroneal veins. There does appear to be partial resolution of the common femoral vein and popliteal pain thrombus with residual findings distally.    Prior to Admission medications   Medication Sig Start Date End Date Taking? Authorizing Provider  aspirin EC 81 MG tablet Take 81 mg by mouth daily.   Yes Historical Provider, MD  Multiple Vitamin (MULTIVITAMIN WITH MINERALS) TABS Take 1 tablet by mouth daily.   Yes Historical Provider, MD  nebivolol (BYSTOLIC) 5 MG tablet Take 1 tablet (5 mg total) by mouth every other day. 05/23/14  Yes Craig Lexavid W Zamere Pasternak, MD   Allergies  Allergen Reactions  . Penicillins Rash    Has patient had a PCN reaction causing immediate rash, facial/tongue/throat swelling, SOB or lightheadedness with hypotension: NoNo Has patient had a PCN reaction causing severe rash involving mucus membranes or skin necrosis: NoNo Has patient had a PCN reaction that required hospitalization No No Has patient had a PCN reaction occurring within the last 10 years: NoNo If all of the above answers are "NO", then may proceed with  Cephalosporin     Social History   Social History  . Marital status: Married    Spouse name: N/A  . Number of children: N/A  . Years of education: N/A   Social History Main Topics  . Smoking status: Never Smoker  . Smokeless tobacco: Never Used  . Alcohol use No  . Drug use: No  . Sexual activity: Not Asked   Other Topics Concern  . None   Social History Narrative   He is married truck Hospital doctor. He plays tennis avidly, but is really been bothered by his knees recently. For that reason has not been out exercising much. He was really to work on his weight loss. Unfortunately of the PE his knees really slowed her progress. He does not drink alcohol and does not smoke.   Family History  Problem Relation Age of Onset  . Cancer Mother   . Diabetes Father   . Stroke Maternal Grandmother   . Diabetes Paternal Grandmother   . Diabetes Sister      Wt Readings from Last 3 Encounters:  07/14/16 101.2 kg (223 lb)  11/20/15 103.4 kg (228 lb)  10/13/15 98.9 kg (218 lb)    PHYSICAL EXAM BP 110/80   Pulse (!) 51   Ht 5\' 4"  (1.626 m)   Wt 101.2 kg (223 lb)   BMI 38.28 kg/m  General appearance: alert, cooperative, appears stated age, Well nourished, well developed, in no acute distress; Moderately obese. HEENT: Pupils are equal round react to light accommodation extraocular movements are intact.  Neck: No cervical lymphadenopathy.  Cardiac: Regular rate and rhythm with 1/6 systolic murmur, no rubs or gallops.  Lungs: clear to auscultation bilaterally, no wheezing, rhonchi or rales  Abd: soft, nontender, positive bowel sounds all quadrants, no hepatosplenomegaly  Ext: lower extremity edema R> L 1+ edema -- support stockings are on (mild swelling above the sock-line). 2+ radial and dorsalis pedis pulses.  Skin: warm and dry  Neuro: Grossly normal; Pleasant mood & affect    Adult ECG Report  Rate: 54 ;  Rhythm: sinus bradycardia and Incomplete RBBB (RSR' in V1);    Narrative Interpretation: otherwise normal axis and intervals and durations.   Other studies Reviewed: Additional studies/ records that were reviewed today include:  Recent Labs:   None available - moniotored by PCP (not in Epic) No results found for: CHOL, HDL, LDLCALC, LDLDIRECT, TRIG, CHOLHDL   ASSESSMENT / PLAN: Problem List Items Addressed This Visit    Obesity (BMI 30-39.9) (Chronic)    The patient understands the need to lose weight with diet and exercise. We have discussed specific strategies for this.      History of pulmonary embolus (PE) (Chronic)    He has completed his full course of anticoagulation. No further symptoms. 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 Doing leg exercises but also getting out to walk. He wears his support stockings while driving.      Essential hypertension - Primary (Chronic)    Excellent blood pressure  control. His only on Bystolic 1/2 tablet daily.      Relevant Medications   furosemide (LASIX) 20 MG tablet   Other Relevant Orders   EKG 12-Lead (Completed)   Edema of right lower extremity (Chronic)    Right greater than left swelling. This is probably related to wear his DVT was. Gave him a prescription for when necessary Lasix in case the swelling is bad. But usually it'[name] controlled with movement, or hose and walking on with elevation at the end of the day.      Relevant Orders   EKG 12-Lead (Completed)   DVT (deep venous thrombosis) (HCC)    No longer has any DVT in the right leg. But he does have worse swelling amount in the left versus right leg. He does wear compression stockings most of time. 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 He asked about potential performing another clot -- this is always a possibility, therefore wearing this support stockings, doing leg exercises, walking when able and taking the aspirin is definitely helpful.      Relevant Medications   furosemide (LASIX) 20 MG tablet      Other Visit Diagnoses   None.     Current medicines are reviewed at length with the patient today. (+/- concerns) Stable His knee pain, asked about using Celebrex The following changes have been made:   Studies Ordered:   Orders Placed This Encounter  Procedures  . EKG 12-Lead   Patient Instructions  MAY USE FUROSEMIDE 20 MG DAILY FOR SWELLING AS NEEDED  NO OTHER CHANGES   Your physician wants you to follow-up in: 12 MONTHS WITH DR Jamichael Knotts. You will receive a reminder letter in the mail two months in advance. If you don't receive a letter, please call our office to schedule the follow-up appointment.   If you need a refill on your cardiac medications before your next appointment, please call your pharmacy.     Bryan Lemma, M.D., M.[name]. Interventional Cardiologist   Pager # [phone]

## 2016-07-15 ENCOUNTER — Encounter: Payer: Self-pay | Admitting: Cardiology

## 2016-07-15 NOTE — Assessment & Plan Note (Signed)
No longer has any DVT in the right leg. But he does have worse swelling amount in the left versus right leg. He does wear compression stockings most of time. 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 He asked about potential performing another clot -- this is always a possibility, therefore wearing this support stockings, doing leg exercises, walking when able and taking the aspirin is definitely helpful.

## 2016-07-15 NOTE — Assessment & Plan Note (Signed)
The patient understands the need to lose weight with diet and exercise. We have discussed specific strategies for this.  

## 2016-07-15 NOTE — Assessment & Plan Note (Signed)
Right greater than left swelling. This is probably related to wear his DVT was. Gave him a prescription for when necessary Lasix in case the swelling is bad. But usually it's controlled with movement, or hose and walking on with elevation at the end of the day.

## 2016-07-15 NOTE — Assessment & Plan Note (Signed)
He has completed his full course of anticoagulation. No further symptoms. 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 Doing leg exercises but also getting out to walk. He wears his support stockings while driving.

## 2016-07-15 NOTE — Assessment & Plan Note (Signed)
Excellent blood pressure control. His only on Bystolic 1/2 tablet daily.

## 2017-01-28 ENCOUNTER — Encounter (HOSPITAL_COMMUNITY): Payer: Self-pay | Admitting: Emergency Medicine

## 2017-01-28 ENCOUNTER — Ambulatory Visit (HOSPITAL_COMMUNITY)
Admission: EM | Admit: 2017-01-28 | Discharge: 2017-01-28 | Disposition: A | Discharge status: Home / Self Care | Payer: BLUE CROSS/BLUE SHIELD | Attending: Family Medicine | Admitting: Family Medicine

## 2017-01-28 DIAGNOSIS — M79604 Pain in right leg: Secondary | ICD-10-CM

## 2017-01-28 DIAGNOSIS — M79661 Pain in right lower leg: Secondary | ICD-10-CM | POA: Diagnosis not present

## 2017-01-28 DIAGNOSIS — M79662 Pain in left lower leg: Secondary | ICD-10-CM | POA: Diagnosis not present

## 2017-01-28 DIAGNOSIS — M79605 Pain in left leg: Secondary | ICD-10-CM

## 2017-01-28 MED ORDER — METHOCARBAMOL 500 MG PO TABS: 500.0000 mg | ORAL_TABLET | Freq: Two times a day (BID) | ORAL | 0 refills | Status: DC
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
## 2017-07-14 ENCOUNTER — Ambulatory Visit: Payer: BLUE CROSS/BLUE SHIELD | Admitting: Cardiology

## 2017-07-21 ENCOUNTER — Encounter: Payer: Self-pay | Admitting: Physician Assistant

## 2017-07-21 ENCOUNTER — Ambulatory Visit (INDEPENDENT_AMBULATORY_CARE_PROVIDER_SITE_OTHER): Payer: BLUE CROSS/BLUE SHIELD | Admitting: Physician Assistant

## 2017-07-21 VITALS — BP 160/88 | HR 48 | Ht 64.0 in | Wt 229.0 lb

## 2017-07-21 DIAGNOSIS — R001 Bradycardia, unspecified: Secondary | ICD-10-CM

## 2017-07-21 DIAGNOSIS — Z01810 Encounter for preprocedural cardiovascular examination: Secondary | ICD-10-CM | POA: Diagnosis not present

## 2017-07-21 DIAGNOSIS — R0789 Other chest pain: Secondary | ICD-10-CM | POA: Diagnosis not present

## 2017-07-21 DIAGNOSIS — I10 Essential (primary) hypertension: Secondary | ICD-10-CM

## 2017-07-21 NOTE — Progress Notes (Signed)
Cardiology Office Note    Date:  07/23/2017   ID:  Craig [name], DOB [birth year], MRN [id number]  PCP:  Laurena Slimmer, MD  Cardiologist:  Dr. Herbie Baltimore  Chief Complaint  Patient presents with  . Follow-up    pt having hip surgery August 26, 2017 by Dr. Charlann Boxer, pt has no issues today    Preop clearance for right hip surgery by Dr. Charlann Boxer on 08/26/2017  History of Present Illness:  Craig [name] is a 66 y.o. male with PMH of HTN and DVT/PE. He had a remote cardiac catheterization on 10/30/2008 by Dr. Jacinto Halim after abnormal stress test, cardiac catheterization revealed EF 55%, small with 20% stenosis in proximal segment of RCA along with significant spasm noted in the RCA region improved with intracoronary nitroglycerin, otherwise normal left main, LAD, left circumflex. Echocardiogram obtained on 10/03/2013 showed normal EF, PA peak pressure 32 mmHg. He was treated for a year with Xarelto due to lower extremity venous Doppler indicating residual right lower extremity chronic DVT. Last venous Doppler in June 2017 was negative for DVT in bilateral lower extremity. Even after DVT resolved, he continued to have right greater than left lower extremity edema and wears compression stockings routinely. He is on Lasix as needed.  Patient presents today for cardiology office visit and also preop clearance. He is planning to undergo right hip surgery by Dr. Charlann Boxer on 08/26/2017. He denies any exertional symptom. Due to the hip pain, he has not been doing much strenuous activity. He did power wash his deck and also cut the grass yesterday without any exertional symptom. He has a very atypical left-sided twinge in the chest, it does not occur with exertion and only last a few seconds at a time. I discussed his case with DOD Dr. Tresa Endo, no further workup is necessary at this time. Due to history of bradycardia, his Bystolic was cut back to 2.5 mg daily, today's EKG shows his heart rate was 48 bpm. However he denies any  dizziness, all fields passing out. Therefore we decided to keep him on the low-dose Bystolic. Blood pressure initially was elevated, manual recheck by myself after 10 minutes was 134/76.   Past Medical History:  Diagnosis Date  . DVT (deep venous thrombosis) (HCC)   . Hypertension   . Hypogonadism male   . Osteoarthritis of both knees     Past Surgical History:  Procedure Laterality Date  . CARDIAC CATHETERIZATION  2006   no blockage  . CARDIAC CATHETERIZATION  10/30/2008  . CARPAL TUNNEL RELEASE  2009   Left  . KNEE ARTHROSCOPY    . Venous Doppler, lower extremity Right 05/18/2013   Chronic appearing DVT with some recanalization involving the femoral vein, posterior tibial and peroneal veins. There does appear to be partial resolution of the common femoral vein and popliteal pain thrombus with residual findings distally.    Current Medications: Outpatient Medications Prior to Visit  Medication Sig Dispense Refill  . aspirin EC 81 MG tablet Take 81 mg by mouth daily.    . methocarbamol (ROBAXIN) 500 MG tablet Take 1 tablet (500 mg total) by mouth 2 (two) times daily. 20 tablet 0  . Multiple Vitamin (MULTIVITAMIN WITH MINERALS) TABS Take 1 tablet by mouth daily.    . nebivolol (BYSTOLIC) 5 MG tablet Take 1 tablet (5 mg total) by mouth every other day. (Patient taking differently: Take 2.5 mg by mouth daily. ) 30 tablet 11  . furosemide (LASIX) 20 MG tablet Take  1 tablet (20 mg total) by mouth daily as needed. 90 tablet 3  . celecoxib (CELEBREX) 200 MG capsule Take 200 mg by mouth daily as needed for mild pain.    Marland Kitchen. diclofenac sodium (VOLTAREN) 1 % GEL Apply 2 g topically 4 (four) times daily.     No facility-administered medications prior to visit.      Allergies:   Penicillins   Social History   Social History  . Marital status: Married    Spouse name: N/A  . Number of children: N/A  . Years of education: N/A   Social History Main Topics  . Smoking status: Never Smoker   . Smokeless tobacco: Never Used  . Alcohol use No  . Drug use: No  . Sexual activity: Not Asked   Other Topics Concern  . 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 For that reason has not been out exercising much. He was really to work on his weight loss. Unfortunately of the PE his knees really slowed her progress. He does not drink alcohol and does not smoke.     Family History:  The patient's family history includes Cancer in his mother; Diabetes in his father, paternal grandmother, and sister; Stroke in his maternal grandmother.   ROS:   Please see the history of present illness.    ROS All other systems reviewed and are negative.   PHYSICAL EXAM:   VS:  BP (!) 160/88 (BP Location: Right Arm, Patient Position: Sitting, Cuff Size: Normal)   Pulse (!) 48   Ht 5\' 4"  (1.626 m)   Wt 229 lb (103.9 kg)   BMI 39.31 kg/m    GEN: Well nourished, well developed, in no acute distress  HEENT: normal  Neck: no JVD, carotid bruits, or masses Cardiac: RRR; no murmurs, rubs, or gallops,no edema  Respiratory:  clear to auscultation bilaterally, normal work of breathing GI: soft, nontender, nondistended, + BS MS: no deformity or atrophy  Skin: warm and dry, no rash Neuro:  Alert and Oriented x 3, Strength and sensation are intact Psych: euthymic mood, full affect  Wt Readings from Last 3 Encounters:  07/21/17 229 lb (103.9 kg)  07/14/16 223 lb (101.2 kg)  11/20/15 228 lb (103.4 kg)      Studies/Labs Reviewed:   EKG:  EKG is ordered today.  The ekg ordered today demonstrates Sinus bradycardia, heart rate 48, incomplete right bundle branch block.  Recent Labs: No results found for requested labs within last 8760 hours.   Lipid Panel No results found for: CHOL, TRIG, HDL, CHOLHDL, VLDL, LDLCALC, LDLDIRECT  Additional studies/ records that were reviewed today include:   Cath  2010    Echo 10/04/2015 - Left ventricle: The cavity size was normal. There was mild concentric hypertrophy. Systolic function was normal. Wall motion was normal; there were no regional wall motion abnormalities. Left ventricular diastolic function parameters were normal. - Mitral valve: Valve area by pressure half-time: 1.82cm^2. - Pulmonary arteries: PA peak pressure: 32mm Hg (S).    Venous doppler 03/22/2016 Summary:  - No evidence of deep vein or superficial thrombosis involving the left lower extremity and right common femoral vein. - No evidence of Baker&'s cyst on the left.   ASSESSMENT:    1. Atypical chest pain   2. Encounter for pre-operative cardiovascular clearance   3. Essential hypertension   4. Preop cardiovascular exam  5. Bradycardia      PLAN:  In order of problems listed above:  1. Preoperative clearance prior to right hip surgery by Charlann Boxer: His chest pain is very atypical, does not occur with exertion, I have discussed the case with DOD Dr. Tresa Endo, no further workup is needed he is cleared from cardiology perspective.  2. Bradycardia: He is asymptomatic with current dose of Bystolic, we'll continue at the current dose.  3. Hypertension: Blood pressure controlled on Bystolic     Medication Adjustments/Labs and Tests Ordered: Current medicines are reviewed at length with the patient today.  Concerns regarding medicines are outlined above.  Medication changes, Labs and Tests ordered today are listed in the Patient Instructions below. Patient Instructions  NO CHANGES WITH CURRENT MEDICATIONS     CLEARANCE FOR HIP SURGERY WITH DR. Charlann Boxer.   Your physician wants you to follow-up in 6 MONTHS WITH DR HARDING. You will receive a reminder letter in the mail two months in advance. If you don't receive a letter, please call our office to schedule the follow-up appointment.    If you need a refill on your cardiac medications before your next  appointment, please call your pharmacy.       Ramond Dial, [place]  07/23/2017 9:21 AM    [facility] [address] [place], [place], [place]  [address] Phone: [phone]; Fax: [phone]

## 2017-07-21 NOTE — Patient Instructions (Addendum)
NO CHANGES WITH CURRENT MEDICATIONS     CLEARANCE FOR HIP SURGERY WITH DR. Charlann BoxerLIN.   Your physician wants you to follow-up in 6 MONTHS WITH DR HARDING. You will receive a reminder letter in the mail two months in advance. If you don't receive a letter, please call our office to schedule the follow-up appointment.    If you need a refill on your cardiac medications before your next appointment, please call your pharmacy.

## 2017-07-22 ENCOUNTER — Ambulatory Visit: Payer: BLUE CROSS/BLUE SHIELD | Admitting: Physician Assistant

## 2017-07-23 ENCOUNTER — Encounter: Payer: Self-pay | Admitting: Physician Assistant

## 2017-07-28 HISTORY — PX: TOTAL HIP ARTHROPLASTY: SHX124

## 2017-08-22 ENCOUNTER — Telehealth: Payer: Self-pay | Admitting: Cardiology

## 2017-08-22 NOTE — Telephone Encounter (Signed)
° °  [facility] Pre-operative Risk Assessment    Request for surgical clearance:  1. What type of surgery is being performed? Rt Hip Arthroplasty   2. When is this surgery scheduled? 08-26-17   3. Are there any medications that need to be held prior to surgery and how long?When can he stop his Aspirin?   4. Practice name and name of physician performing surgery? Dr Paralee Cancel   5. What is your office phone and fax number? [phone] and fax is [phone]   6. Anesthesia type (None, local, MAC, general) ? Spinal Block   Craig [name] 08/22/2017, 12:40 PM  _________________________________________________________________   (provider comments below)

## 2017-08-22 NOTE — Telephone Encounter (Signed)
   Chart reviewed as part of pre-operative protocol coverage. Given past medical history and time since last visit, based on ACC/AHA guidelines, Craig [name] would be at acceptable risk for the planned procedure without further cardiovascular testing.  The patient was evaluated by Azalee CourseHao Meng, PA-C on 07/21/2017 and cleared for surgery from a cardiac perspective at that time.   I will route this recommendation to the requesting party via Epic fax function and remove from pre-op pool.  Please call with questions.  Ellsworth LennoxBrittany M Johnnie Goynes, PA-C 08/22/2017, 2:22 PM

## 2017-08-23 ENCOUNTER — Telehealth: Payer: Self-pay | Admitting: Cardiology

## 2017-08-23 NOTE — Telephone Encounter (Signed)
New Message   Fax [phone]   They need aspirin addressed today for surgical clearance, can the patient stop the aspirin he is on.     They received the surgical clearance but it had no instructions on the aspirin, they need something in writing to go to anesthesia

## 2017-08-23 NOTE — Telephone Encounter (Signed)
Message regarding ASA attached to original surgical clearance and faxed to Dr. Charlann Boxerlin at [phone]

## 2017-08-23 NOTE — Telephone Encounter (Signed)
Ok to hold ASA for surgery. Can hold 5 days prior. Restart once ok w/ surgeon.

## 2018-10-25 ENCOUNTER — Telehealth: Payer: Self-pay | Admitting: Cardiology

## 2018-10-25 NOTE — Telephone Encounter (Signed)
Spoke with patient who reports sharp chest pains off and on and arm weakness (not all the time, one incident). This has been going on over a 4 day period. His chest pains can occur with rest, not necessarily reproducible with movement. He reports the pain does not last long. He has NO shortness of breath. He has an OV on Monday with MD but would like to come in sooner. Scheduled to see Bjorn LoserRhonda PA @ 11am on 1/31

## 2018-10-25 NOTE — Telephone Encounter (Signed)
Pt c/o of Chest Pain: STAT if CP now or developed within 24 hours  1. Are you having CP right now? No, comes off and on  2. Are you experiencing any other symptoms (ex. SOB, nausea, vomiting, sweating)? Arms felt weak at times  3. How long have you been experiencing CP? About last 4 days  4. Is your CP continuous or coming and going? Coming and going  5. Have you taken Nitroglycerin? No  Made patient appt for 10/30/18 with Dr Herbie Baltimore ?

## 2018-10-27 ENCOUNTER — Encounter: Payer: Self-pay | Admitting: Physician Assistant

## 2018-10-27 ENCOUNTER — Ambulatory Visit (INDEPENDENT_AMBULATORY_CARE_PROVIDER_SITE_OTHER): Payer: BLUE CROSS/BLUE SHIELD | Admitting: Physician Assistant

## 2018-10-27 ENCOUNTER — Encounter (INDEPENDENT_AMBULATORY_CARE_PROVIDER_SITE_OTHER): Payer: Self-pay

## 2018-10-27 ENCOUNTER — Telehealth (HOSPITAL_COMMUNITY): Payer: Self-pay

## 2018-10-27 VITALS — BP 127/75 | HR 54 | Ht 64.0 in | Wt 230.4 lb

## 2018-10-27 DIAGNOSIS — R001 Bradycardia, unspecified: Secondary | ICD-10-CM | POA: Diagnosis not present

## 2018-10-27 DIAGNOSIS — R079 Chest pain, unspecified: Secondary | ICD-10-CM | POA: Diagnosis not present

## 2018-10-27 DIAGNOSIS — I10 Essential (primary) hypertension: Secondary | ICD-10-CM

## 2018-10-27 MED ORDER — NEBIVOLOL HCL 5 MG PO TABS: 2.5000 mg | ORAL_TABLET | Freq: Every day | ORAL | 12 refills | Status: DC

## 2018-10-27 NOTE — Telephone Encounter (Signed)
Encounter complete. 

## 2018-10-27 NOTE — Progress Notes (Signed)
Cardiology Office Note   Date:  10/27/2018   ID:  Craig [name], DOB [birth year], MRN [id number]  PCP:  [name], MD Cardiologist:  Bryan Lemma, MD 07/14/2016 Craig [name], PAC, 07/21/2017 Craig Demark, PA-C   No chief complaint on file.   History of Present Illness: Craig [name] is a 68 y.o. male with a history of DVT (now off anticoag), HTN, OA, obesity, LE edema  01/29 phone notes regarding chest pain, appt made  Craig [name] presents for cardiology follow up.   He is still driving a truck, wears compression socks. Is s/p THR, not walking or exercising as much because his knees are getting bad. No real cardio workout.   He had sharp chest pain, lower L chest, near lower edge of sternum. He might get multiple episodes in a day, or none at all. Feels he had 5-10 episodes since it started about a week ago. Duration < 30 sec, 3/10. No change w/ deep inspiration, no associated sx.   He had an episode of arm weakness. Not with exertion, no other sx. He rested and sx resolved in < 5".   Has been moving furniture at the house recently, no sx w/ that. That is the most strenuous thing he does.   Is planning to have knees replaced, probably not till the fall.  He saw a Vein specialist, was told he had no clot, does have reflux some of the veins  Has minimal LE edema w/ compression socks, they help a lot.    Past Medical History:  Diagnosis Date  . DVT (deep venous thrombosis) (HCC)   . Hypertension   . Hypogonadism male   . OA (osteoarthritis) of hip    s/p R THR  . Osteoarthritis of both knees     Past Surgical History:  Procedure Laterality Date  . CARDIAC CATHETERIZATION  2006   no blockage  . CARDIAC CATHETERIZATION  10/30/2008  . CARPAL TUNNEL RELEASE  2009   Left  . KNEE ARTHROSCOPY    . TOTAL HIP ARTHROPLASTY Right 07/2017   [facility]  . Venous Doppler, lower extremity Right 05/18/2013   Chronic appearing DVT with some recanalization  involving the femoral vein, posterior tibial and peroneal veins. There does appear to be partial resolution of the common femoral vein and popliteal pain thrombus with residual findings distally.    Current Outpatient Medications  Medication Sig Dispense Refill  . aspirin EC 81 MG tablet Take 81 mg by mouth daily.    . Multiple Vitamin (MULTIVITAMIN WITH MINERALS) TABS Take 1 tablet by mouth daily.    . nebivolol (BYSTOLIC) 5 MG tablet Take 2.5 mg by mouth daily.    Marland Kitchen PENNSAID 2 % SOLN Apply 2 Pump topically as directed.  1   No current facility-administered medications for this visit.     Allergies:   Penicillins    Social History:  The patient  reports that he has never smoked. He has never used smokeless tobacco. He reports that he does not drink alcohol or use drugs.   Family History:  The patient's family history includes Cancer in his mother; Diabetes in his father, paternal grandmother, and sister; Stroke in his maternal grandmother.  He indicated that his mother is deceased. He indicated that his father is alive. He indicated that the status of his sister is unknown. He indicated that his brother is alive. He indicated that his maternal grandmother is deceased. He indicated that  his maternal grandfather is deceased. He indicated that his paternal grandmother is deceased. He indicated that his paternal grandfather is deceased.    ROS:  Please see the history of present illness. All other systems are reviewed and negative.    PHYSICAL EXAM: VS:  BP 127/75   Pulse (!) 54   Ht 5\' 4"  (1.626 m)   Wt 230 lb 6.4 oz (104.5 kg)   SpO2 97%   BMI 39.55 kg/m  , BMI Body mass index is 39.55 kg/m. GEN: Well nourished, well developed, male in no acute distress HEENT: normal for age  Neck: no JVD, no carotid bruit, no masses Cardiac: RRR; no murmur, no rubs, or gallops Respiratory:  clear to auscultation bilaterally, normal work of breathing GI: soft, nontender, nondistended, + BS MS:  no deformity or atrophy; no edema; distal pulses are 2+ in all 4 extremities  Skin: warm and dry, no rash Neuro:  Strength and sensation are intact Psych: euthymic mood, full affect   EKG:  EKG is ordered today. The ekg ordered today demonstrates sinus bradycardia, heart rate 54, incomplete right bundle which is old, no acute ischemic changes, no significant change from 07/21/2017  ECHO: 10/03/2013 - Left ventricle: The cavity size was normal. There was mild concentric hypertrophy. Systolic function was normal. Wall motion was normal; there were no regional wall motion abnormalities. Left ventricular diastolic function parameters were normal. - Mitral valve: Valve area by pressure half-time: 1.82cm^2. - Pulmonary arteries: PA peak pressure: 41mm Hg (S).  Recent Labs: No results found for requested labs within last 8760 hours.  CBC    Component Value Date/Time   WBC 5.8 09/02/2015 2215   RBC 4.90 09/02/2015 2215   HGB 14.3 09/02/2015 2215   HCT 42.7 09/02/2015 2215   PLT 213 09/02/2015 2215   MCV 87.1 09/02/2015 2215   MCH 29.2 09/02/2015 2215   MCHC 33.5 09/02/2015 2215   RDW 13.3 09/02/2015 2215   LYMPHSABS 1.5 02/06/2013 2306   MONOABS 0.4 02/06/2013 2306   EOSABS 0.2 02/06/2013 2306   BASOSABS 0.0 02/06/2013 2306   CMP Latest Ref Rng & Units 09/02/2015 02/06/2013 01/24/2013  Glucose 65 - 99 mg/dL 540(G) 867(Y) 97  BUN 6 - 20 mg/dL 10 12 10   Creatinine 0.61 - 1.24 mg/dL 1.95 0.93 2.67  Sodium 135 - 145 mmol/L 137 141 139  Potassium 3.5 - 5.1 mmol/L 4.0 3.9 4.3  Chloride 101 - 111 mmol/L 103 104 106  CO2 22 - 32 mmol/L 27 29 28   Calcium 8.9 - 10.3 mg/dL 9.5 9.4 9.2  Total Protein 6.0 - 8.3 g/dL - 6.8 -  Total Bilirubin 0.3 - 1.2 mg/dL - 0.5 -  Alkaline Phos 39 - 117 U/L - 69 -  AST 0 - 37 U/L - 21 -  ALT 0 - 53 U/L - 19 -     Lipid Panel from [facility]: 02/02/2018 Total cholesterol 155 HDL 47 LDL 83 Triglycerides 125  Hemoglobin A1c 6.1      Wt Readings  from Last 3 Encounters:  10/27/18 230 lb 6.4 oz (104.5 kg)  07/21/17 229 lb (103.9 kg)  07/14/16 223 lb (101.2 kg)     Other studies Reviewed: Additional studies/ records that were reviewed today include: Office notes, hospital records and testing.  ASSESSMENT AND PLAN:  1.  Chest pain: His symptoms have some atypical features, but he has multiple cardiac risk factors making him moderate risk -Additionally, he is planning to have knee replacements and they  will likely want an ischemic evaluation prior to the surgery. -Because of his knee problems, I explained that I did not feel confident he would hit target heart rate on the treadmill. -Therefore, I will order a Lexiscan Myoview - I explained to him that I felt that if this were normal, I would feel confident his chest pain was noncardiac.  2.  History of DVT: He has no signs or symptoms of a DVT now and has seen Vein specialist who did some testing and told him he did not have a DVT.  He is encouraged to increase his activity as tolerated and continue wearing the compression socks  3.  Hypertension: He has a diagnosis of hypertension, but is not currently on any meds for it to Bystolic.  His blood pressure is well controlled.  No changes  4.  Bradycardia: His heart rate is in the 50s today and he is asymptomatic.  Continue low-dose Bystolic   Current medicines are reviewed at length with the patient today.  The patient does not have concerns regarding medicines.  The following changes have been made:  no change  Labs/ tests ordered today include:  No orders of the defined types were placed in this encounter.    Disposition:   FU with Bryan Lemmaavid Harding, MD  Signed, Craig Demarkhonda , PA-C  10/27/2018 11:48 AM    [facility] Phone: [phone]; Fax: [phone]

## 2018-10-27 NOTE — Patient Instructions (Signed)
Testing/Procedures: Your physician has requested that you have a lexiscan myoview. A cardiac stress test is a cardiological test that measures the heart's ability to respond to external stress in a controlled clinical environment. The stress response is induced by intravenous pharmacological stimulation.   Follow-Up: You will need a follow up appointment in 12 months.  Please call our office 2 months in advance to schedule this appointment.  You may see Bryan Lemmaavid Harding, MD or one of the following Advanced Practice Providers on your designated Care Team:  Joni ReiningKathryn Lawrence, DNP, AACC Theodore Demarkhonda Barrett, PA-C  Medication Instructions:  NO CHANGES- Your physician recommends that you continue on your current medications as directed. Please refer to the Current Medication list given to you today. If you need a refill on your cardiac medications before your next appointment, please call your pharmacy. Labwork: NONE  When you have your labs (blood work) drawn today and your tests are completely normal, you will receive your results only by MyChart Message (if you have MyChart) -OR-  A paper copy in the mail.  If you have any lab test that is abnormal or we need to change your treatment, we will call you to review these results. At [facility], you and your health needs are our priority.  As part of our continuing mission to provide you with exceptional heart care, we have created designated Provider Care Teams.  These Care Teams include your primary Cardiologist (physician) and Advanced Practice Providers (APPs -  Physician Assistants and Nurse Practitioners) who all work together to provide you with the care you need, when you need it.  Thank you for choosing [facility] at [facility]!!

## 2018-10-30 ENCOUNTER — Ambulatory Visit: Payer: BLUE CROSS/BLUE SHIELD | Admitting: Cardiology

## 2018-10-31 ENCOUNTER — Telehealth (HOSPITAL_COMMUNITY): Payer: Self-pay

## 2018-10-31 NOTE — Telephone Encounter (Signed)
Encounter complete. 

## 2018-11-01 ENCOUNTER — Ambulatory Visit (HOSPITAL_COMMUNITY)
Admission: RE | Admit: 2018-11-01 | Discharge: 2018-11-01 | Disposition: A | Discharge status: Home / Self Care | Payer: BLUE CROSS/BLUE SHIELD | Source: Ambulatory Visit | Attending: Cardiology | Admitting: Cardiology

## 2018-11-01 DIAGNOSIS — R079 Chest pain, unspecified: Secondary | ICD-10-CM | POA: Diagnosis present

## 2018-11-01 LAB — MYOCARDIAL PERFUSION IMAGING
CSEPPHR: 76 {beats}/min
LV sys vol: 67 mL
LVDIAVOL: 128 mL (ref 62–150)
NUC STRESS TID: 1.11
Rest HR: 47 {beats}/min
SDS: 2
SRS: 0
SSS: 2

## 2018-11-01 MED ORDER — TECHNETIUM TC 99M TETROFOSMIN IV KIT
10.0000 | PACK | Freq: Once | INTRAVENOUS | Status: AC | PRN
  Administered 2018-11-01: 10 via INTRAVENOUS
  Filled 2018-11-01: qty 10

## 2018-11-01 MED ORDER — TECHNETIUM TC 99M TETROFOSMIN IV KIT
32.4000 | PACK | Freq: Once | INTRAVENOUS | Status: AC | PRN
  Administered 2018-11-01: 32.4 via INTRAVENOUS
  Filled 2018-11-01: qty 33

## 2018-11-01 MED ORDER — REGADENOSON 0.4 MG/5ML IV SOLN
0.4000 mg | Freq: Once | INTRAVENOUS | Status: AC
  Administered 2018-11-01: 0.4 mg via INTRAVENOUS

## 2019-07-13 DIAGNOSIS — Z6841 Body Mass Index (BMI) 40.0 and over, adult: Secondary | ICD-10-CM | POA: Diagnosis not present

## 2019-07-13 DIAGNOSIS — R350 Frequency of micturition: Secondary | ICD-10-CM | POA: Diagnosis not present

## 2019-07-13 DIAGNOSIS — R7303 Prediabetes: Secondary | ICD-10-CM | POA: Diagnosis not present

## 2019-07-13 DIAGNOSIS — R29898 Other symptoms and signs involving the musculoskeletal system: Secondary | ICD-10-CM | POA: Diagnosis not present

## 2019-07-13 DIAGNOSIS — Z23 Encounter for immunization: Secondary | ICD-10-CM | POA: Diagnosis not present

## 2019-07-13 DIAGNOSIS — I10 Essential (primary) hypertension: Secondary | ICD-10-CM | POA: Diagnosis not present

## 2019-07-27 DIAGNOSIS — B029 Zoster without complications: Secondary | ICD-10-CM | POA: Diagnosis not present

## 2019-08-17 DIAGNOSIS — M170 Bilateral primary osteoarthritis of knee: Secondary | ICD-10-CM | POA: Diagnosis not present

## 2019-09-23 DIAGNOSIS — Z20828 Contact with and (suspected) exposure to other viral communicable diseases: Secondary | ICD-10-CM | POA: Diagnosis not present

## 2019-10-23 DIAGNOSIS — H52203 Unspecified astigmatism, bilateral: Secondary | ICD-10-CM | POA: Diagnosis not present

## 2019-10-23 DIAGNOSIS — H524 Presbyopia: Secondary | ICD-10-CM | POA: Diagnosis not present

## 2019-10-23 DIAGNOSIS — H5213 Myopia, bilateral: Secondary | ICD-10-CM | POA: Diagnosis not present

## 2019-11-15 DIAGNOSIS — M170 Bilateral primary osteoarthritis of knee: Secondary | ICD-10-CM | POA: Diagnosis not present

## 2019-11-15 DIAGNOSIS — M25562 Pain in left knee: Secondary | ICD-10-CM | POA: Diagnosis not present

## 2019-11-15 DIAGNOSIS — M1711 Unilateral primary osteoarthritis, right knee: Secondary | ICD-10-CM | POA: Diagnosis not present

## 2019-11-15 DIAGNOSIS — M25561 Pain in right knee: Secondary | ICD-10-CM | POA: Diagnosis not present

## 2019-11-15 DIAGNOSIS — M1712 Unilateral primary osteoarthritis, left knee: Secondary | ICD-10-CM | POA: Diagnosis not present

## 2019-12-03 ENCOUNTER — Telehealth (INDEPENDENT_AMBULATORY_CARE_PROVIDER_SITE_OTHER): Payer: BC Managed Care – PPO | Admitting: Cardiology

## 2019-12-03 ENCOUNTER — Encounter: Payer: Self-pay | Admitting: Cardiology

## 2019-12-03 DIAGNOSIS — I10 Essential (primary) hypertension: Secondary | ICD-10-CM

## 2019-12-03 DIAGNOSIS — R0789 Other chest pain: Secondary | ICD-10-CM

## 2019-12-03 DIAGNOSIS — Z86711 Personal history of pulmonary embolism: Secondary | ICD-10-CM

## 2019-12-03 DIAGNOSIS — Z86718 Personal history of other venous thrombosis and embolism: Secondary | ICD-10-CM

## 2019-12-03 DIAGNOSIS — E669 Obesity, unspecified: Secondary | ICD-10-CM

## 2019-12-03 DIAGNOSIS — R600 Localized edema: Secondary | ICD-10-CM

## 2019-12-03 NOTE — Progress Notes (Signed)
Virtual Visit via Telephone Note   This visit type was conducted due to national recommendations for restrictions regarding the COVID-19 Pandemic (e.g. social distancing) in an effort to limit this patient's exposure and mitigate transmission in our community.  Due to his co-morbid illnesses, this patient is at least at moderate risk for complications without adequate follow up.  This format is felt to be most appropriate for this patient at this time.  The patient did not have access to video technology/had technical difficulties with video requiring transitioning to audio format only (telephone).  All issues noted in this document were discussed and addressed.  No physical exam could be performed with this format.  Please refer to the patient's chart for his  consent to telehealth for [facility].   The patient was identified using 2 identifiers.  Date:  12/03/2019   ID:  Craig [name], DOB May 10, 1951, MRN [id number]  Patient Location: Home Provider Location: Home  PCP:  Seward Carol, MD  Cardiologist:  Glenetta Hew, MD  Electrophysiologist:  None   Evaluation Performed:  Follow-Up Visit  Chief Complaint:  none  History of Present Illness:    Craig [name] is a 69 y.o. male with a history of prior DVT and PE in 2014, no longer on anticoagulation, treated hypertension, minor coronary disease at catheterization with a 20% RCA narrowing.  In Feb 2020 he had some atypical chest pain, a Myoview was done and was low risk. He drives a truck long distance.  He was contacted today for routine follow-up.  Since we saw him last January he has been doing well.  He has had both his COVID vaccines.  He has chronic right lower extremity edema from his DVT.  His primary care provider follows his blood pressure.  Although he did not have actual readings he said that is been controlled, he is currently on the road in [place].  The patient does not have symptoms concerning for COVID-19 infection  (fever, chills, cough, or new shortness of breath).    Past Medical History:  Diagnosis Date  . DVT (deep venous thrombosis) ([place])   . Hypertension   . Hypogonadism male   . OA (osteoarthritis) of hip    s/p R THR  . Osteoarthritis of both knees    Past Surgical History:  Procedure Laterality Date  . CARDIAC CATHETERIZATION  2006   no blockage  . CARDIAC CATHETERIZATION  10/30/2008  . CARPAL TUNNEL RELEASE  2009   Left  . KNEE ARTHROSCOPY    . TOTAL HIP ARTHROPLASTY Right 07/2017   Dr Alvan Dame, at [place]  . Venous Doppler, lower extremity Right 05/18/2013   Chronic appearing DVT with some recanalization involving the femoral vein, posterior tibial and peroneal veins. There does appear to be partial resolution of the common femoral vein and popliteal pain thrombus with residual findings distally.     No outpatient medications have been marked as taking for the 12/03/19 encounter (Appointment) with Erlene Quan, PA-C.     Allergies:   Penicillins   Social History   Tobacco Use  . Smoking status: Never Smoker  . Smokeless tobacco: Never Used  Substance Use Topics  . Alcohol use: No  . Drug use: No     Family Hx: The patient's family history includes Cancer in his mother; Diabetes in his father, paternal grandmother, and sister; Stroke in his maternal grandmother.  ROS:   Please see the history of present illness.  No syncope or pre syncope All other systems reviewed and are negative.   Prior CV studies:   The following studies were reviewed today: Cath 2010 Echo 2015 Myoview 11/01/2018  Labs/Other Tests and Data Reviewed:    EKG:  An ECG dated 10/27/2018 was personally reviewed today and demonstrated:  NSR, SB 50, inferior Qs, incomplete RBBB  Recent Labs: No results found for requested labs within last 8760 hours.   Recent Lipid Panel No results found for: CHOL, TRIG, HDL, CHOLHDL, LDLCALC, LDLDIRECT  Wt Readings from Last 3 Encounters:  11/01/18  230 lb (104.3 kg)  10/27/18 230 lb 6.4 oz (104.5 kg)  07/21/17 229 lb (103.9 kg)     Objective:    Vital Signs:  There were no vitals taken for this visit.   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  The importance of social distancing was discussed today.  Time:   Today, I have spent 10 minutes with the patient with telehealth technology discussing the above problems.     Medication Adjustments/Labs and Tests Ordered: Current medicines are reviewed at length with the patient today.  Concerns regarding medicines are outlined above.   Tests Ordered: No orders of the defined types were placed in this encounter.   Medication Changes: No orders of the defined types were placed in this encounter.   Follow Up:  In Person One year with Dr Herbie Baltimore  Signed, Corine Shelter, PA-C  12/03/2019 8:22 AM    [facility]

## 2019-12-03 NOTE — Patient Instructions (Signed)
Medication Instructions:  The current medical regimen is effective;  continue present plan and medications as directed. Please refer to the Current Medication list given to you today. If you need a refill on your cardiac medications before your next appointment, please call your pharmacy.  Follow-Up: 12 months Please call our office 2 months in advance, JAN 2022 to schedule this MAR 2022 appointment. In Person You may see Bryan Lemma, MD Corine Shelter, PA-C or one of the following Advanced Practice Providers on your designated Care Team:  Theodore Demark, PA-C  Joni Reining, DNP, ANP  Cadence Fransico Michael, NP.    At [facility], you and your health needs are our priority.  As part of our continuing mission to provide you with exceptional heart care, we have created designated Provider Care Teams.  These Care Teams include your primary Cardiologist (physician) and Advanced Practice Providers (APPs -  Physician Assistants and Nurse Practitioners) who all work together to provide you with the care you need, when you need it.  Reduce your risk of getting COVID-19 With your heart disease it is especially important for people at increased risk of severe illness from COVID-19, and those who live with them, to protect themselves from getting COVID-19. The best way to protect yourself and to help reduce the spread of the virus that causes COVID-19 is to: Marland Kitchen Limit your interactions with other people as much as possible. . Take COVID-19 when you do interact with others. If you start feeling sick and think you may have COVID-19, get in touch with your healthcare provider within 24 hours.  Thank you for choosing [facility] at [facility]!!

## 2019-12-14 ENCOUNTER — Other Ambulatory Visit: Payer: Self-pay | Admitting: Physician Assistant

## 2020-02-15 DIAGNOSIS — M25551 Pain in right hip: Secondary | ICD-10-CM | POA: Diagnosis not present

## 2020-02-15 DIAGNOSIS — Z96641 Presence of right artificial hip joint: Secondary | ICD-10-CM | POA: Diagnosis not present

## 2020-02-15 DIAGNOSIS — M7061 Trochanteric bursitis, right hip: Secondary | ICD-10-CM | POA: Diagnosis not present

## 2020-02-22 DIAGNOSIS — Z0000 Encounter for general adult medical examination without abnormal findings: Secondary | ICD-10-CM | POA: Diagnosis not present

## 2020-02-22 DIAGNOSIS — I10 Essential (primary) hypertension: Secondary | ICD-10-CM | POA: Diagnosis not present

## 2020-02-22 DIAGNOSIS — R7303 Prediabetes: Secondary | ICD-10-CM | POA: Diagnosis not present

## 2020-02-22 DIAGNOSIS — Z125 Encounter for screening for malignant neoplasm of prostate: Secondary | ICD-10-CM | POA: Diagnosis not present

## 2020-02-22 DIAGNOSIS — R350 Frequency of micturition: Secondary | ICD-10-CM | POA: Diagnosis not present

## 2020-02-22 DIAGNOSIS — Z13220 Encounter for screening for lipoid disorders: Secondary | ICD-10-CM | POA: Diagnosis not present

## 2020-04-24 DIAGNOSIS — M25561 Pain in right knee: Secondary | ICD-10-CM | POA: Diagnosis not present

## 2020-04-24 DIAGNOSIS — M1712 Unilateral primary osteoarthritis, left knee: Secondary | ICD-10-CM | POA: Diagnosis not present

## 2020-04-24 DIAGNOSIS — M170 Bilateral primary osteoarthritis of knee: Secondary | ICD-10-CM | POA: Diagnosis not present

## 2020-04-24 DIAGNOSIS — M25562 Pain in left knee: Secondary | ICD-10-CM | POA: Diagnosis not present

## 2020-04-24 DIAGNOSIS — M1711 Unilateral primary osteoarthritis, right knee: Secondary | ICD-10-CM | POA: Diagnosis not present

## 2020-06-06 ENCOUNTER — Other Ambulatory Visit: Payer: Self-pay | Admitting: Internal Medicine

## 2020-06-06 ENCOUNTER — Ambulatory Visit
Admission: RE | Admit: 2020-06-06 | Discharge: 2020-06-06 | Disposition: A | Discharge status: Home / Self Care | Payer: BC Managed Care – PPO | Source: Ambulatory Visit | Attending: Internal Medicine | Admitting: Internal Medicine

## 2020-06-06 DIAGNOSIS — M6281 Muscle weakness (generalized): Secondary | ICD-10-CM | POA: Diagnosis not present

## 2020-06-06 DIAGNOSIS — M4802 Spinal stenosis, cervical region: Secondary | ICD-10-CM | POA: Diagnosis not present

## 2020-06-06 DIAGNOSIS — R29898 Other symptoms and signs involving the musculoskeletal system: Secondary | ICD-10-CM

## 2020-06-06 DIAGNOSIS — G5600 Carpal tunnel syndrome, unspecified upper limb: Secondary | ICD-10-CM | POA: Diagnosis not present

## 2020-06-06 DIAGNOSIS — R531 Weakness: Secondary | ICD-10-CM | POA: Diagnosis not present

## 2020-06-06 DIAGNOSIS — M47812 Spondylosis without myelopathy or radiculopathy, cervical region: Secondary | ICD-10-CM | POA: Diagnosis not present

## 2020-06-06 DIAGNOSIS — M47813 Spondylosis without myelopathy or radiculopathy, cervicothoracic region: Secondary | ICD-10-CM | POA: Diagnosis not present

## 2020-06-23 ENCOUNTER — Other Ambulatory Visit: Payer: Self-pay | Admitting: Internal Medicine

## 2020-06-23 DIAGNOSIS — R29898 Other symptoms and signs involving the musculoskeletal system: Secondary | ICD-10-CM

## 2020-07-12 ENCOUNTER — Other Ambulatory Visit: Payer: Self-pay

## 2020-07-12 ENCOUNTER — Ambulatory Visit
Admission: RE | Admit: 2020-07-12 | Discharge: 2020-07-12 | Disposition: A | Discharge status: Home / Self Care | Payer: BC Managed Care – PPO | Source: Ambulatory Visit | Attending: Internal Medicine | Admitting: Internal Medicine

## 2020-07-12 DIAGNOSIS — R29898 Other symptoms and signs involving the musculoskeletal system: Secondary | ICD-10-CM

## 2020-07-20 ENCOUNTER — Other Ambulatory Visit: Payer: Self-pay

## 2020-07-20 ENCOUNTER — Ambulatory Visit
Admission: RE | Admit: 2020-07-20 | Discharge: 2020-07-20 | Disposition: A | Discharge status: Home / Self Care | Payer: BC Managed Care – PPO | Source: Ambulatory Visit | Attending: Internal Medicine | Admitting: Internal Medicine

## 2020-07-20 DIAGNOSIS — M4802 Spinal stenosis, cervical region: Secondary | ICD-10-CM | POA: Diagnosis not present

## 2020-07-22 DIAGNOSIS — M4712 Other spondylosis with myelopathy, cervical region: Secondary | ICD-10-CM | POA: Diagnosis not present

## 2020-07-22 DIAGNOSIS — M4802 Spinal stenosis, cervical region: Secondary | ICD-10-CM | POA: Diagnosis not present

## 2020-10-02 DIAGNOSIS — N401 Enlarged prostate with lower urinary tract symptoms: Secondary | ICD-10-CM | POA: Diagnosis not present

## 2020-10-02 DIAGNOSIS — R351 Nocturia: Secondary | ICD-10-CM | POA: Diagnosis not present

## 2020-10-02 DIAGNOSIS — R202 Paresthesia of skin: Secondary | ICD-10-CM | POA: Diagnosis not present

## 2020-10-02 DIAGNOSIS — N5201 Erectile dysfunction due to arterial insufficiency: Secondary | ICD-10-CM | POA: Diagnosis not present

## 2020-10-02 DIAGNOSIS — R7309 Other abnormal glucose: Secondary | ICD-10-CM | POA: Diagnosis not present

## 2020-10-06 ENCOUNTER — Encounter: Payer: Self-pay | Admitting: Neurology

## 2020-10-10 ENCOUNTER — Other Ambulatory Visit: Payer: Self-pay

## 2020-10-10 DIAGNOSIS — R202 Paresthesia of skin: Secondary | ICD-10-CM

## 2020-10-27 DIAGNOSIS — H524 Presbyopia: Secondary | ICD-10-CM | POA: Diagnosis not present

## 2020-10-27 DIAGNOSIS — H5213 Myopia, bilateral: Secondary | ICD-10-CM | POA: Diagnosis not present

## 2020-10-27 DIAGNOSIS — H2513 Age-related nuclear cataract, bilateral: Secondary | ICD-10-CM | POA: Diagnosis not present

## 2020-10-27 DIAGNOSIS — H52203 Unspecified astigmatism, bilateral: Secondary | ICD-10-CM | POA: Diagnosis not present

## 2020-11-04 DIAGNOSIS — M4712 Other spondylosis with myelopathy, cervical region: Secondary | ICD-10-CM | POA: Diagnosis not present

## 2020-11-18 ENCOUNTER — Other Ambulatory Visit: Payer: Self-pay

## 2020-11-18 ENCOUNTER — Ambulatory Visit (INDEPENDENT_AMBULATORY_CARE_PROVIDER_SITE_OTHER): Payer: BC Managed Care – PPO | Admitting: Neurology

## 2020-11-18 DIAGNOSIS — R202 Paresthesia of skin: Secondary | ICD-10-CM

## 2020-11-18 DIAGNOSIS — M5417 Radiculopathy, lumbosacral region: Secondary | ICD-10-CM

## 2020-11-18 DIAGNOSIS — G629 Polyneuropathy, unspecified: Secondary | ICD-10-CM

## 2020-11-18 NOTE — Procedures (Signed)
[facility]  [address] [place], [address]  [place], [place] [address] Tel: [phone] Fax:  [phone] Test Date:  11/18/2020  Patient: Craig [name] DOB: [birth year] Physician: Nita Sickle, DO  Sex: Male Height: 5\' 4"  Ref Phys: , MD  ID#: Craig [name]   Technician:    Patient Complaints: This is a 71 year old man referred for evaluation of bilateral feet pain and paresthesias.  NCV & EMG Findings: Extensive electrodiagnostic testing of the right lower extremity and additional studies of the left shows:  1. Bilateral superficial peroneal sensory responses are within normal limits.  Bilateral sural sensory responses are absent. 2. Bilateral tibial motor responses showed reduced amplitude (R3.2, L2.7 mV).  Bilateral peroneal motor responses are within normal limits. 3. Bilateral tibial H reflex studies are within normal limits. 4. Chronic motor axonal loss changes are seen affecting the L2-4 myotomes bilaterally, without accompanying active denervation.  Impression: 1. The electrophysiologic findings are consistent with a chronic and symmetric sensorimotor axonal polyneuropathy affecting the lower extremities, moderate in degree electrically. 2. 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   ___________________________ 62, DO    Nerve Conduction Studies Anti Sensory Summary Table   Stim Site NR Peak (ms) Norm Peak (ms) P-T Amp (V) Norm P-T Amp  Left Sup Peroneal Anti Sensory (Ant Lat Mall)  12 cm    2.9 <4.6 6.2 >3  Right Sup Peroneal Anti Sensory (Ant Lat Mall)  12 cm    2.3 <4.6 6.3 >3  Left Sural Anti Sensory (Lat Mall)  Calf NR  <4.6  >3  Right Sural Anti Sensory (Lat Mall)  Calf NR  <4.6  >3   Motor Summary Table   Stim Site NR Onset (ms) Norm Onset (ms) O-P Amp (mV) Norm O-P Amp Site1 Site2 Delta-0 (ms) Dist (cm) Vel (m/s) Norm Vel (m/s)  Left Peroneal Motor (Ext Dig Brev)  Ankle     3.3 <6.0 2.6 >2.5 B Fib Ankle 8.5 37.0 44 >40  B Fib    11.8  2.1  Poplt B Fib 1.7 8.0 47 >40  Poplt    13.5  2.0         Right Peroneal Motor (Ext Dig Brev)  Ankle    2.7 <6.0 2.5 >2.5 B Fib Ankle 8.6 38.0 44 >40  B Fib    11.3  1.6  Poplt B Fib 1.4 8.0 57 >40  Poplt    12.7  1.4         Left Tibial Motor (Abd Hall Brev)  Ankle    4.1 <6.0 2.7 >4 Knee Ankle 8.9 46.0 45 >40  Knee    13.0  1.3         Right Tibial Motor (Abd Hall Brev)  Ankle    4.1 <6.0 3.2 >4 Knee Ankle 9.7 46.0 47 >40  Knee    13.8  0.8          H Reflex Studies   NR H-Lat (ms) Lat Norm (ms) L-R H-Lat (ms)  Left Tibial (Gastroc)     34.15 <35 0.00  Right Tibial (Gastroc)     34.15 <35 0.00   EMG   Side Muscle Ins Act Fibs Psw Fasc Number Recrt Dur Dur. Amp Amp. Poly Poly. Comment  Right AntTibialis Nml Nml Nml Nml Nml Nml Nml Nml Nml Nml Nml Nml N/A  Right Gastroc Nml Nml Nml Nml Nml Nml Nml Nml Nml Nml Nml Nml N/A  Right  Flex Dig Long Nml Nml Nml Nml Nml Nml Nml Nml Nml Nml Nml Nml N/A  Right RectFemoris Nml Nml Nml Nml 1- Rapid Some 1+ Some 1+ Some 1+ N/A  Right GluteusMed Nml Nml Nml Nml Nml Nml Nml Nml Nml Nml Nml Nml N/A  Right AdductorLong Nml Nml Nml Nml 1- Rapid Some 1+ Some 1+ Some 1+ N/A  Left AntTibialis Nml Nml Nml Nml Nml Nml Nml Nml Nml Nml Nml Nml N/A  Left Flex Dig Long Nml Nml Nml Nml Nml Nml Nml Nml Nml Nml Nml Nml N/A  Left RectFemoris Nml Nml Nml Nml 1- Rapid Some 1+ Some 1+ Some 1+ N/A  Left GluteusMed Nml Nml Nml Nml Nml Nml Nml Nml Nml Nml Nml Nml N/A  Left AdductorLong Nml Nml Nml Nml 1- Rapid Some 1+ Some 1+ Some 1+ N/A  Left BicepsFemS Nml Nml Nml Nml Nml Nml Nml Nml Nml Nml Nml Nml N/A  Left Gastroc Nml Nml Nml Nml Nml Nml Nml Nml Nml Nml Nml Nml N/A      Waveforms:

## 2020-12-01 ENCOUNTER — Telehealth: Payer: Self-pay | Admitting: Neurology

## 2020-12-01 NOTE — Telephone Encounter (Signed)
Results sent to ordering doctor, Dr. Idelle Crouch office. Please ask patient to contact their office for results.

## 2020-12-01 NOTE — Telephone Encounter (Signed)
Please fax emg results as requested.

## 2020-12-01 NOTE — Telephone Encounter (Signed)
Results faxed.

## 2020-12-01 NOTE — Telephone Encounter (Signed)
Pt called he stated he would like his results faxed to Dr Lovell Sheehan at [facility]. Pt stated he did a release for it to be faxed to them the day of his appointment,

## 2020-12-09 ENCOUNTER — Other Ambulatory Visit: Payer: Self-pay | Admitting: Physician Assistant

## 2020-12-15 DIAGNOSIS — M4712 Other spondylosis with myelopathy, cervical region: Secondary | ICD-10-CM | POA: Diagnosis not present

## 2020-12-19 DIAGNOSIS — M170 Bilateral primary osteoarthritis of knee: Secondary | ICD-10-CM | POA: Diagnosis not present

## 2020-12-23 ENCOUNTER — Ambulatory Visit (INDEPENDENT_AMBULATORY_CARE_PROVIDER_SITE_OTHER): Payer: BC Managed Care – PPO | Admitting: Physician Assistant

## 2020-12-23 ENCOUNTER — Other Ambulatory Visit: Payer: Self-pay

## 2020-12-23 ENCOUNTER — Encounter: Payer: Self-pay | Admitting: Physician Assistant

## 2020-12-23 VITALS — BP 122/70 | HR 48 | Ht 63.5 in | Wt 234.4 lb

## 2020-12-23 DIAGNOSIS — Z86718 Personal history of other venous thrombosis and embolism: Secondary | ICD-10-CM | POA: Diagnosis not present

## 2020-12-23 DIAGNOSIS — Z01810 Encounter for preprocedural cardiovascular examination: Secondary | ICD-10-CM

## 2020-12-23 DIAGNOSIS — I2510 Atherosclerotic heart disease of native coronary artery without angina pectoris: Secondary | ICD-10-CM

## 2020-12-23 DIAGNOSIS — R001 Bradycardia, unspecified: Secondary | ICD-10-CM | POA: Diagnosis not present

## 2020-12-23 DIAGNOSIS — I10 Essential (primary) hypertension: Secondary | ICD-10-CM

## 2020-12-23 NOTE — Patient Instructions (Signed)
Medication Instructions:   HOLD Aspirin for 7 days prior to neck surgery  *If you need a refill on your cardiac medications before your next appointment, please call your pharmacy*  Lab Work: NONE ordered at this time of appointment   If you have labs (blood work) drawn today and your tests are completely normal, you will receive your results only by: Marland Kitchen MyChart Message (if you have MyChart) OR . A paper copy in the mail If you have any lab test that is abnormal or we need to change your treatment, we will call you to review the results.  Testing/Procedures: NONE ordered at this time of appointment   Follow-Up: At [facility], you and your health needs are our priority.  As part of our continuing mission to provide you with exceptional heart care, we have created designated Provider Care Teams.  These Care Teams include your primary Cardiologist (physician) and Advanced Practice Providers (APPs -  Physician Assistants and Nurse Practitioners) who all work together to provide you with the care you need, when you need it.  Your next appointment:   12 month(s)  The format for your next appointment:   In Person  Provider:   Bryan Lemma, MD  Other Instructions

## 2020-12-23 NOTE — Progress Notes (Signed)
Cardiology Office Note:    Date:  12/25/2020   ID:  Craig [name], DOB 08/07/1951, MRN [id number]  PCP:  [name], MD   [facility]  Cardiologist:  Bryan Lemma, MD  Advanced Practice Provider:  No care team member to display Electrophysiologist:  None   Referring MD: [name], MD   Chief Complaint  Patient presents with  . Follow-up    Seen for Dr. Herbie Baltimore    History of Present Illness:    Craig [name] is a 70 y.o. male with a hx of HTN and DVT/PE. He had a remote cardiac catheterization on 10/30/2008 by Dr. Jacinto Halim after abnormal stress test, cardiac catheterization revealed EF 55%, small with 20% stenosis in proximal segment of RCA along with significant spasm noted in the RCA region improved with intracoronary nitroglycerin, otherwise normal left main, LAD, left circumflex. Echocardiogram obtained on 10/03/2013 showed normal EF, PA peak pressure 32 mmHg. He was treated for a year with Xarelto due to lower extremity venous Doppler indicating residual right lower extremity chronic DVT. Last venous Doppler in June 2017 was negative for DVT in bilateral lower extremity. Even after DVT resolved, he continued to have right greater than left lower extremity edema and wears compression stockings routinely. He is on Lasix as needed.  He was seen by Theodore Demark on 10/27/2018 at which time he was having some atypical chest pain.  Heart rate was in the low 50s at the time however he was asymptomatic.  Myoview obtained on 11/01/2018 showed EF 48%, small defect of mild severity present in the basal inferior location, the defect was not reversible and consistent with diaphragmatic attenuation artifact, overall low risk study.  He was last seen via virtual visit by Corine Shelter PA-C on 12/03/2019 at which time he was doing well.  Patient presents today for follow-up.  He denies any recent chest discomfort.  He does have mild worsening dyspnea with exertion however he says that is  related to his knee issue and deconditioning.  Patient is still driving trucks long distance.  He has not noticed any worsening lower extremity edema.  He wears a compression stocking.  Blood pressure is very well controlled on current therapy.  Heart rate is 48 bpm today which is borderline low however patient does not have any symptom such as dizziness, blurred vision or feeling of passing out.  I recommended continue on the 2.5 mg of nebivolol for the time being.  If heart rate continue to drop in the future, we may have to stop the nebivolol.  Patient is also pending upcoming cervical neck surgery by Dr. Lovell Sheehan.  He is cleared for the procedure.  He will need to hold aspirin for 7 days prior to the procedure and restart as soon as possible afterward.   Past Medical History:  Diagnosis Date  . DVT (deep venous thrombosis) (HCC)   . Hypertension   . Hypogonadism male   . OA (osteoarthritis) of hip    s/p R THR  . Osteoarthritis of both knees     Past Surgical History:  Procedure Laterality Date  . CARDIAC CATHETERIZATION  2006   no blockage  . CARDIAC CATHETERIZATION  10/30/2008  . CARPAL TUNNEL RELEASE  2009   Left  . KNEE ARTHROSCOPY    . TOTAL HIP ARTHROPLASTY Right 07/2017   [facility]  . Venous Doppler, lower extremity Right 05/18/2013   Chronic appearing DVT with some recanalization involving the  femoral vein, posterior tibial and peroneal veins. There does appear to be partial resolution of the common femoral vein and popliteal pain thrombus with residual findings distally.    Current Medications: Current Meds  Medication Sig  . aspirin EC 81 MG tablet Take 81 mg by mouth daily.  . celecoxib (CELEBREX) 200 MG capsule Take 200 mg by mouth daily.  . clindamycin (CLEOCIN) 300 MG capsule Take 300 mg by mouth 3 (three) times daily. Take 3 tablets prior to dental procedures  . diclofenac Sodium (VOLTAREN) 1 % GEL Apply 2 gm per upper extremity joint  .  gabapentin (NEURONTIN) 100 MG capsule Take 100 mg by mouth daily.  . Multiple Vitamin (MULTIVITAMIN WITH MINERALS) TABS Take 1 tablet by mouth daily.  . nebivolol (BYSTOLIC) 5 MG tablet TAKE 1/2 TABLET BY MOUTH DAILY  . OVER THE COUNTER MEDICATION Take 1 tablet by mouth daily. Vein Supplement  . tadalafil (CIALIS) 5 MG tablet Take 5 mg by mouth as needed.     Allergies:   Penicillins   Social History   Socioeconomic History  . Marital status: Married    Spouse name: Not on file  . Number of children: Not on file  . Years of education: Not on file  . Highest education level: Not on file  Occupational History  . Not on file  Tobacco Use  . Smoking status: Never Smoker  . Smokeless tobacco: Never Used  Substance and Sexual Activity  . Alcohol use: No  . Drug use: No  . Sexual activity: Not on file  Other Topics Concern  . Not on file  Social History Narrative   He is married truck Hospital doctor. He plays tennis avidly, but is really been bothered by his knees recently. For that reason has not been out exercising much. He was really to work on his weight loss. Unfortunately of the PE his knees really slowed her progress. He does not drink alcohol and does not smoke.   Social Determinants of Health   Financial Resource Strain: Not on file  Food Insecurity: Not on file  Transportation Needs: Not on file  Physical Activity: Not on file  Stress: Not on file  Social Connections: Not on file     Family History: The patient's family history includes Cancer in his mother; Diabetes in his father, paternal grandmother, and sister; Stroke in his maternal grandmother.  ROS:   Please see the history of present illness.     All other systems reviewed and are negative.  EKGs/Labs/Other Studies Reviewed:    The following studies were reviewed today:  Myoview 11/01/2018  The left ventricular ejection fraction is mildly decreased (45-54%).  Nuclear stress EF: 48%.  There was no ST segment  deviation noted during stress.  There is a small defect of mild severity present in the basal inferior location. The defect is non-reversible and consistent with diaphragmatic attenuation artifact.  There is a small defect of mild severity present in the apex location. The defect is reversible and could represent a small area of ischemia vs. Variations in diaphragmatic attenuation artifact.  This is a low risk study.    EKG:  EKG is ordered today.  The ekg ordered today demonstrates sinus bradycardia, heart rate 48 bpm.  Recent Labs: No results found for requested labs within last 8760 hours.  Recent Lipid Panel No results found for: CHOL, TRIG, HDL, CHOLHDL, VLDL, LDLCALC, LDLDIRECT   Risk Assessment/Calculations:       Physical Exam:  VS:  BP 122/70   Pulse (!) 48   Ht 5' 3.5" (1.613 m)   Wt 234 lb 6.4 oz (106.3 kg)   SpO2 97%   BMI 40.87 kg/m     Wt Readings from Last 3 Encounters:  12/23/20 234 lb 6.4 oz (106.3 kg)  11/01/18 230 lb (104.3 kg)  10/27/18 230 lb 6.4 oz (104.5 kg)     GEN:  Well nourished, well developed in no acute distress HEENT: Normal NECK: No JVD; No carotid bruits LYMPHATICS: No lymphadenopathy CARDIAC: RRR, no murmurs, rubs, gallops RESPIRATORY:  Clear to auscultation without rales, wheezing or rhonchi  ABDOMEN: Soft, non-tender, non-distended MUSCULOSKELETAL:  No edema; No deformity  SKIN: Warm and dry NEUROLOGIC:  Alert and oriented x 3 PSYCHIATRIC:  Normal affect   ASSESSMENT:    1. Coronary artery disease involving native coronary artery of native heart without angina pectoris   2. Essential hypertension   3. History of DVT (deep vein thrombosis)   4. Preoperative cardiovascular examination   5. Bradycardia    PLAN:    In order of problems listed above:  1. CAD: Minimal disease on previous cardiac catheterization.  He denies any recent chest pain.  Continue aspirin and nebivolol.  2. Hypertension: Blood pressure well  controlled  3. History of DVT: No recent recurrence  4. Preoperative clearance: Upcoming neck surgery by Dr. Lovell Sheehan.  He denies any recent chest discomfort.  He may hold aspirin for 7 days prior to the surgery and restart as soon as possible after the procedure.  5. Asymptomatic bradycardia: Heart rate in the high 40s, he is asymptomatic, will continue on the current dose of nebivolol   Medication Adjustments/Labs and Tests Ordered: Current medicines are reviewed at length with the patient today.  Concerns regarding medicines are outlined above.  Orders Placed This Encounter  Procedures  . EKG 12-Lead   No orders of the defined types were placed in this encounter.   Patient Instructions  Medication Instructions:   HOLD Aspirin for 7 days prior to neck surgery  *If you need a refill on your cardiac medications before your next appointment, please call your pharmacy*  Lab Work: NONE ordered at this time of appointment   If you have labs (blood work) drawn today and your tests are completely normal, you will receive your results only by: Marland Kitchen MyChart Message (if you have MyChart) OR . A paper copy in the mail If you have any lab test that is abnormal or we need to change your treatment, we will call you to review the results.  Testing/Procedures: NONE ordered at this time of appointment   Follow-Up: At [facility], you and your health needs are our priority.  As part of our continuing mission to provide you with exceptional heart care, we have created designated Provider Care Teams.  These Care Teams include your primary Cardiologist (physician) and Advanced Practice Providers (APPs -  Physician Assistants and Nurse Practitioners) who all work together to provide you with the care you need, when you need it.  Your next appointment:   12 month(s)  The format for your next appointment:   In Person  Provider:   Bryan Lemma, MD  Other Instructions      Signed, Azalee Course, PA  12/25/2020 10:26 AM    [facility]

## 2020-12-25 ENCOUNTER — Encounter: Payer: Self-pay | Admitting: Physician Assistant

## 2021-01-07 DIAGNOSIS — M4712 Other spondylosis with myelopathy, cervical region: Secondary | ICD-10-CM | POA: Diagnosis not present

## 2021-02-11 ENCOUNTER — Other Ambulatory Visit: Payer: Self-pay | Admitting: Cardiology

## 2021-02-24 DIAGNOSIS — Z6841 Body Mass Index (BMI) 40.0 and over, adult: Secondary | ICD-10-CM | POA: Diagnosis not present

## 2021-02-24 DIAGNOSIS — Z125 Encounter for screening for malignant neoplasm of prostate: Secondary | ICD-10-CM | POA: Diagnosis not present

## 2021-02-24 DIAGNOSIS — E8770 Fluid overload, unspecified: Secondary | ICD-10-CM | POA: Diagnosis not present

## 2021-02-24 DIAGNOSIS — I10 Essential (primary) hypertension: Secondary | ICD-10-CM | POA: Diagnosis not present

## 2021-02-24 DIAGNOSIS — Z0000 Encounter for general adult medical examination without abnormal findings: Secondary | ICD-10-CM | POA: Diagnosis not present

## 2021-02-24 DIAGNOSIS — R7303 Prediabetes: Secondary | ICD-10-CM | POA: Diagnosis not present

## 2021-03-20 IMAGING — MR MR CERVICAL SPINE W/O CM
5 series · 28 of 48 positions shown · non-contrast
Comparison: Neck CTA 08/19/2003

CLINICAL DATA: Weakness in the arms and tightness of the shoulders
on and off for 6 months.

EXAM:
MRI CERVICAL SPINE WITHOUT CONTRAST
TECHNIQUE: Multiplanar, multisequence MR imaging of the cervical spine was
performed. No intravenous contrast was administered.

[Series 3: T2 · sagittal · 3.0mm · 0.66mm/px · 6 of 12 slices shown (1 of 2)]
[im 1/12]
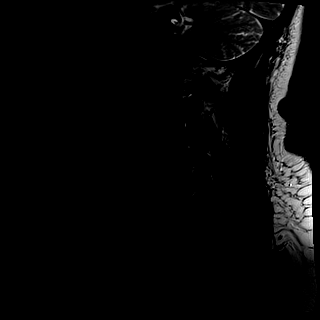
[im 3/12]
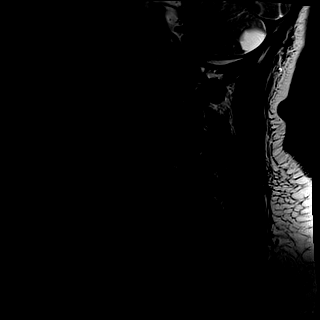
[im 5/12]
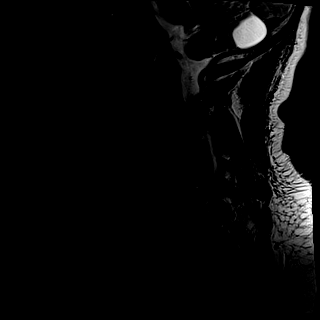
[im 7/12]
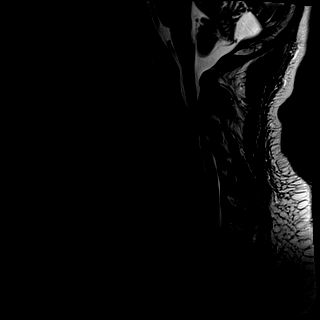
[im 9/12]
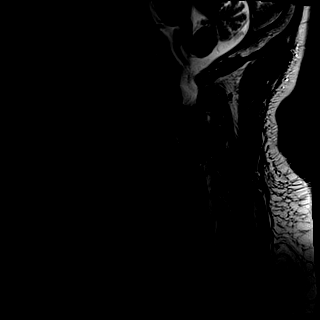
[im 12/12]
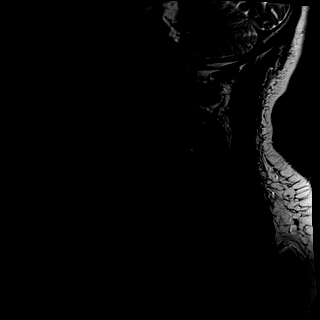

[Series 4: T1 · sagittal · 3.0mm · 0.41mm/px · 6 of 12 slices shown]
[im 1/12]
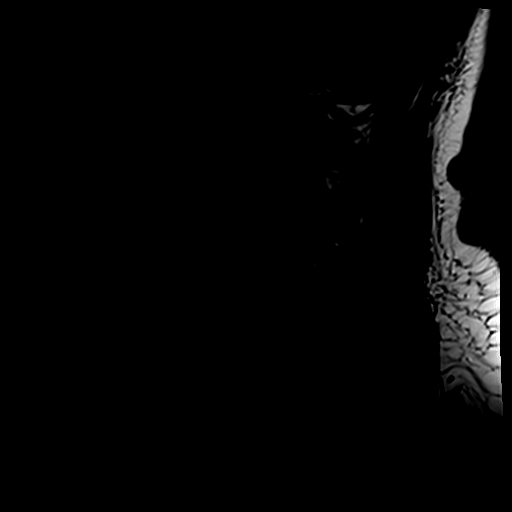
[im 3/12]
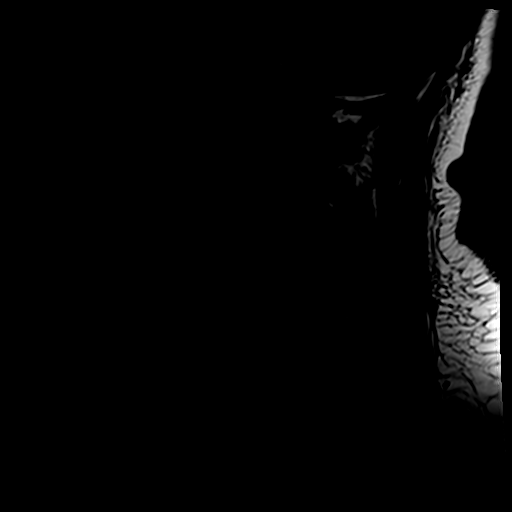
[im 5/12]
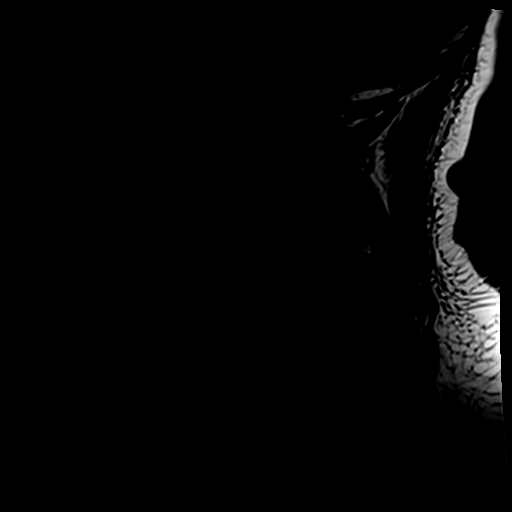
[im 7/12]
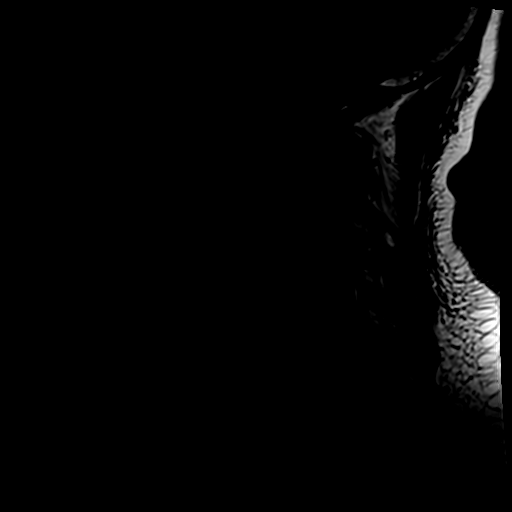
[im 9/12]
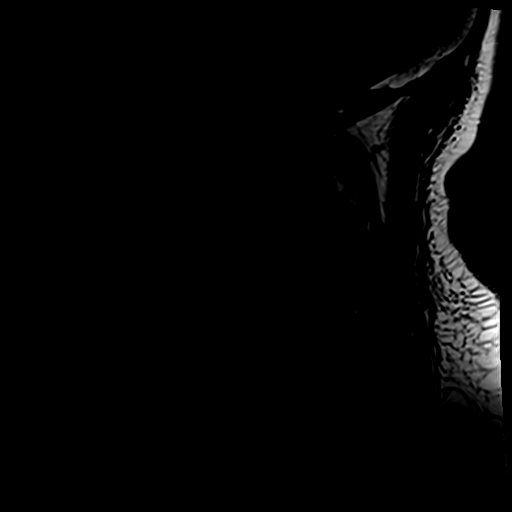
[im 12/12]
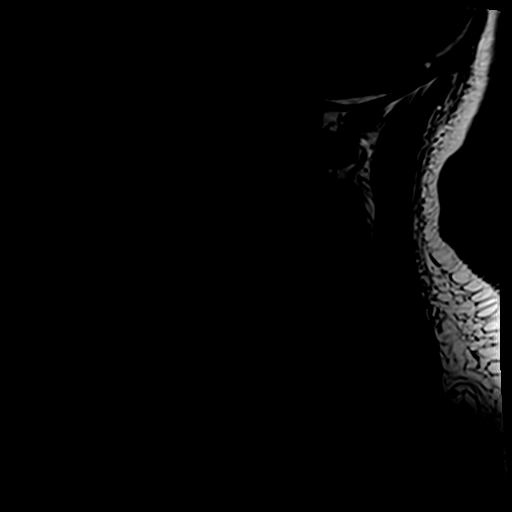

[Series 5: tir sag · sagittal · 3.0mm · 0.41mm/px · 6 of 12 slices shown]
[im 1/12]
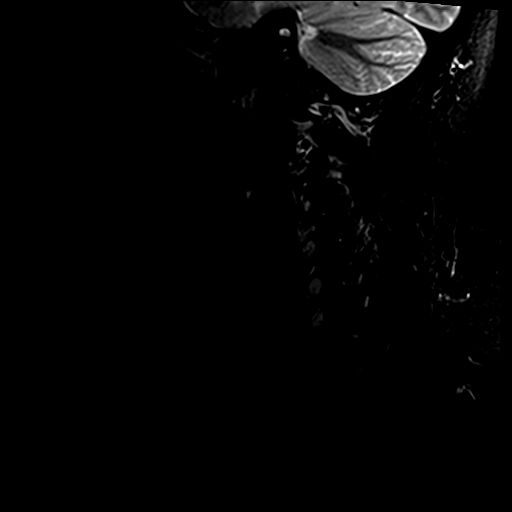
[im 3/12]
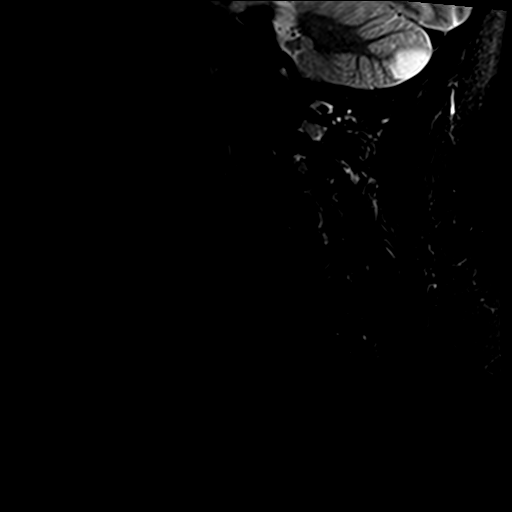
[im 5/12]
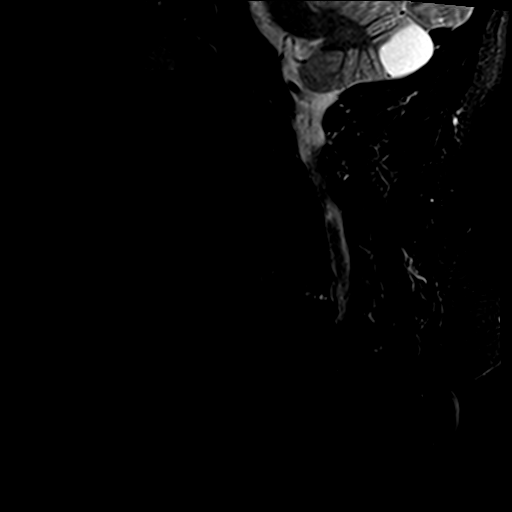
[im 7/12]
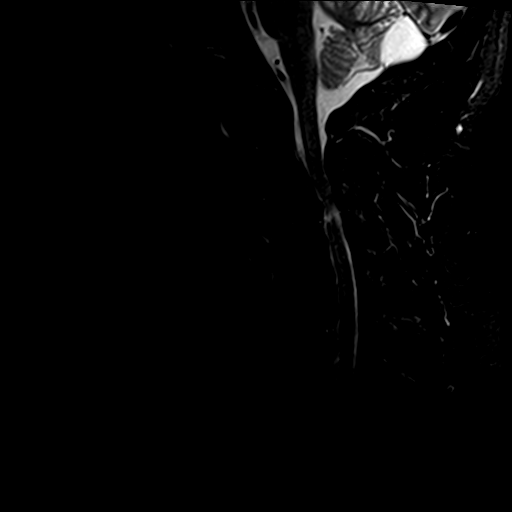
[im 9/12]
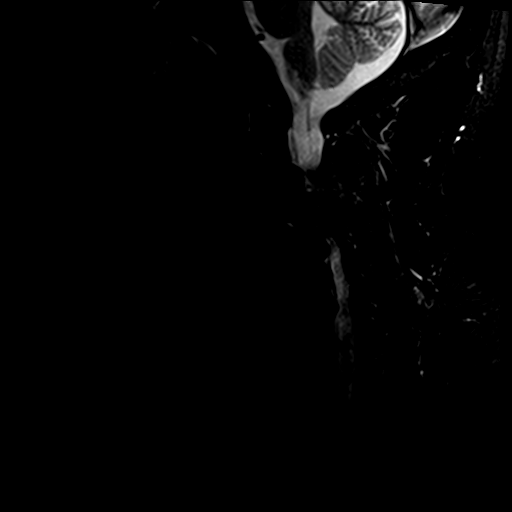
[im 12/12]
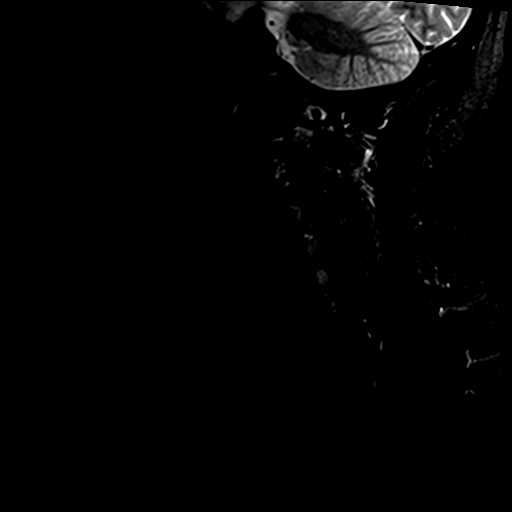

[Series 6: GRE · axial · 3.0mm · 0.35mm/px · 1 of 28 slices shown]
[im 1/28]
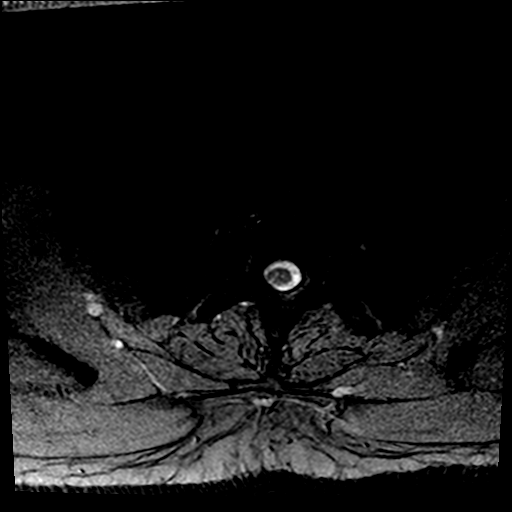

[Series 7: T2 · axial · 3.0mm · 0.70mm/px · z∈[-8,+92]mm · 9 of 28 slices shown (2 of 2)]
[im 1/28]
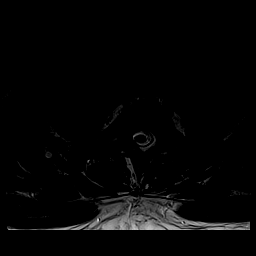
[im 4/28]
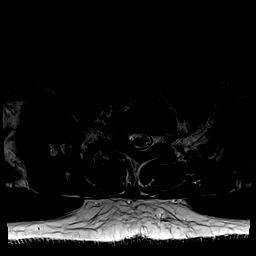
[im 8/28]
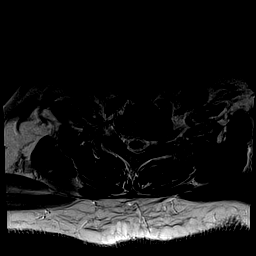
[im 12/28]
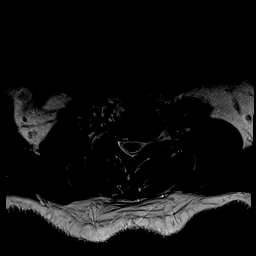
[im 14/28]
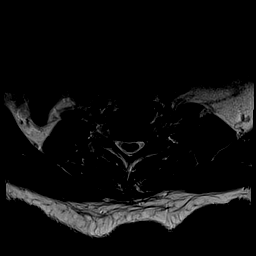
[im 16/28]
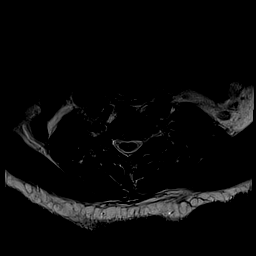
[im 20/28]
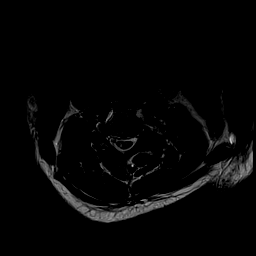
[im 24/28]
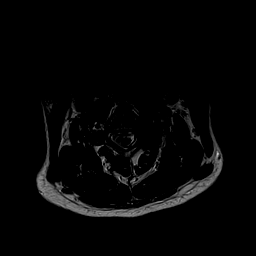
[im 28/28]
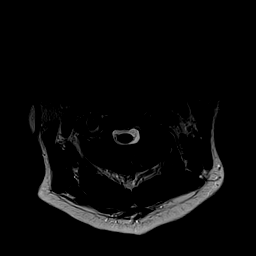

[28 of 48 positions shown; findings below may reference images not displayed]

FINDINGS: Alignment: Degenerative reversal of cervical lordosis with 2 mm
anterolisthesis at C2-3 and 3 mm of anterolisthesis at C3-4.

Vertebrae: No fracture, evidence of discitis, or bone lesion.

Cord: T2 hyperintensity in the bilateral central cord with probable
volume loss, seen at the level of C3-4 compression.

Posterior Fossa, vertebral arteries, paraspinal tissues: Cystic
structure in the right paramedian posterior fossa behind the
cerebellum measuring up to 22 mm, also seen in 4008 and consistent
with arachnoid cyst.

Disc levels:

C2-3: Degenerative facet spurring on the left. Disc is narrowed and
bulging with left uncovertebral spurring. Advanced left foraminal
stenosis.

C3-4: Facet osteoarthritis with spurring and anterolisthesis. There
is disc narrowing and bulging with endplate ridging asymmetric to
the left. Spinal stenosis with cord flattening. Severe left
foraminal impingement. Mild right foraminal narrowing

C4-5: Disc narrowing and left eccentric uncovertebral ridging.
Asymmetric left facet spurring. Moderate left foraminal impingement.

C5-6: Disc narrowing and bulging with uncovertebral ridging. Mild
facet spurring. Moderate bilateral foraminal narrowing. Patent
spinal canal

C6-7: Disc collapse and bulging with endplate and uncovertebral
ridging eccentric to the right. Advanced right foraminal stenosis.
Moderate left foraminal narrowing. Patent canal

C7-T1:Disc narrowing and bulging with moderate biforaminal stenosis.
Degenerative facet spurring on both sides
IMPRESSION: 1. Advanced disc and facet degeneration with C2-3 and C3-4
anterolisthesis.
2. C3-4 spinal stenosis with cord signal abnormality favoring post
compressive myelomalacia over edema.
3. Advanced foraminal impingement on the left at C2-3 to C3-4.
Moderate left foraminal narrowing at C6-7 and C7-T1.
4. Prominent right foraminal impingement at C5-6 and C6-7.

## 2021-04-03 DIAGNOSIS — B029 Zoster without complications: Secondary | ICD-10-CM | POA: Diagnosis not present

## 2021-06-04 NOTE — Progress Notes (Signed)
Sent message, via epic in basket, requesting orders in epic from surgeon.  

## 2021-06-16 DIAGNOSIS — M1712 Unilateral primary osteoarthritis, left knee: Secondary | ICD-10-CM | POA: Diagnosis not present

## 2021-06-16 DIAGNOSIS — I10 Essential (primary) hypertension: Secondary | ICD-10-CM | POA: Diagnosis not present

## 2021-06-16 DIAGNOSIS — R600 Localized edema: Secondary | ICD-10-CM | POA: Diagnosis not present

## 2021-06-16 NOTE — Progress Notes (Addendum)
DUE TO COVID-19 ONLY ONE VISITOR IS ALLOWED TO COME WITH YOU AND STAY IN THE WAITING ROOM ONLY DURING PRE OP AND PROCEDURE DAY OF SURGERY.  2 VISITOR  MAY VISIT WITH YOU AFTER SURGERY IN YOUR PRIVATE ROOM DURING VISITING HOURS ONLY!  YOU NEED TO HAVE A COVID 19 TEST ON__9/26/22 @_  @_from  8am-3pm _____, THIS TEST MUST BE DONE BEFORE SURGERY,  Covid test is done at [address] [place], [address].  This is a drive thru.  No appt required. Please see map.                 Your procedure is scheduled on:  06/25/2021   Report to [facility] Main  Entrance   Report to admitting at   0715 AM     Call this number if you have problems the morning of surgery [phone]    REMEMBER: NO  SOLID FOOD CANDY OR GUM AFTER MIDNIGHT. CLEAR LIQUIDS UNTIL  0700am        . NOTHING BY MOUTH EXCEPT CLEAR LIQUIDS UNTIL  0700 am   . PLEASE FINISH ENSURE DRINK PER SURGEON ORDER  WHICH NEEDS TO BE COMPLETED AT  0700am     .      CLEAR LIQUID DIET   Foods Allowed                                                                    Coffee and tea, regular and decaf                            Fruit ices (not with fruit pulp)                                      Iced Popsicles                                    Carbonated beverages, regular and diet                                    Cranberry, grape and apple juices Sports drinks like Gatorade Lightly seasoned clear broth or consume(fat free) Sugar, honey syrup ___________________________________________________________________      BRUSH YOUR TEETH MORNING OF SURGERY AND RINSE YOUR MOUTH OUT, NO CHEWING GUM CANDY OR MINTS.     Take these medicines the morning of surgery with A SIP OF WATER: Bystolic     DO NOT TAKE ANY DIABETIC MEDICATIONS DAY OF YOUR SURGERY                               You may not have any metal on your body including hair pins and              piercings  Do not wear jewelry, make-up, lotions, powders or  perfumes, deodorant             Do not  wear nail polish on your fingernails.  Do not shave  48 hours prior to surgery.              Men may shave face and neck.   Do not bring valuables to the hospital. [place].  Contacts, dentures or bridgework may not be worn into surgery.  Leave suitcase in the car. After surgery it may be brought to your room.     Patients discharged the day of surgery will not be allowed to drive home. IF YOU ARE HAVING SURGERY AND GOING HOME THE SAME DAY, YOU MUST HAVE AN ADULT TO DRIVE YOU HOME AND BE WITH YOU FOR 24 HOURS. YOU MAY GO HOME BY TAXI OR UBER OR ORTHERWISE, BUT AN ADULT MUST ACCOMPANY YOU HOME AND STAY WITH YOU FOR 24 HOURS.  Name and phone number of your driver:  Special Instructions: N/A              Please read over the following fact sheets you were given: _____________________________________________________________________  [facility] - Preparing for Surgery Before surgery, you can play an important role.  Because skin is not sterile, your skin needs to be as free of germs as possible.  You can reduce the number of germs on your skin by washing with CHG (chlorahexidine gluconate) soap before surgery.  CHG is an antiseptic cleaner which kills germs and bonds with the skin to continue killing germs even after washing. Please DO NOT use if you have an allergy to CHG or antibacterial soaps.  If your skin becomes reddened/irritated stop using the CHG and inform your nurse when you arrive at Short Stay. Do not shave (including legs and underarms) for at least 48 hours prior to the first CHG shower.  You may shave your face/neck. Please follow these instructions carefully:  1.  Shower with CHG Soap the night before surgery and the  morning of Surgery.  2.  If you choose to wash your hair, wash your hair first as usual with your  normal  shampoo.  3.  After you shampoo, rinse your hair and body thoroughly  to remove the  shampoo.                           4.  Use CHG as you would any other liquid soap.  You can apply chg directly  to the skin and wash                       Gently with a scrungie or clean washcloth.  5.  Apply the CHG Soap to your body ONLY FROM THE NECK DOWN.   Do not use on face/ open                           Wound or open sores. Avoid contact with eyes, ears mouth and genitals (private parts).                       Wash face,  Genitals (private parts) with your normal soap.             6.  Wash thoroughly, paying special attention to the area where your surgery  will be performed.  7.  Thoroughly rinse your body with warm water  from the neck down.  8.  DO NOT shower/wash with your normal soap after using and rinsing off  the CHG Soap.                9.  Pat yourself dry with a clean towel.            10.  Wear clean pajamas.            11.  Place clean sheets on your bed the night of your first shower and do not  sleep with pets. Day of Surgery : Do not apply any lotions/deodorants the morning of surgery.  Please wear clean clothes to the hospital/surgery center.  FAILURE TO FOLLOW THESE INSTRUCTIONS MAY RESULT IN THE CANCELLATION OF YOUR SURGERY PATIENT SIGNATURE_________________________________  NURSE SIGNATURE__________________________________  ________________________________________________________________________

## 2021-06-19 ENCOUNTER — Other Ambulatory Visit: Payer: Self-pay

## 2021-06-19 ENCOUNTER — Encounter (HOSPITAL_COMMUNITY)
Admission: RE | Admit: 2021-06-19 | Discharge: 2021-06-19 | Disposition: A | Discharge status: Home / Self Care | Payer: BC Managed Care – PPO | Source: Ambulatory Visit | Attending: Orthopedic Surgery | Admitting: Orthopedic Surgery

## 2021-06-19 ENCOUNTER — Encounter (HOSPITAL_COMMUNITY): Payer: Self-pay

## 2021-06-19 DIAGNOSIS — Z01812 Encounter for preprocedural laboratory examination: Secondary | ICD-10-CM | POA: Insufficient documentation

## 2021-06-19 HISTORY — DX: Prediabetes: R73.03

## 2021-06-19 HISTORY — DX: Peripheral vascular disease, unspecified: I73.9

## 2021-06-19 HISTORY — DX: Other complications of anesthesia, initial encounter: T88.59XA

## 2021-06-19 LAB — CBC
HCT: 41.3 % (ref 39.0–52.0)
Hemoglobin: 13.6 g/dL (ref 13.0–17.0)
MCH: 29.8 pg (ref 26.0–34.0)
MCHC: 32.9 g/dL (ref 30.0–36.0)
MCV: 90.6 fL (ref 80.0–100.0)
Platelets: 216 10*3/uL (ref 150–400)
RBC: 4.56 MIL/uL (ref 4.22–5.81)
RDW: 13.5 % (ref 11.5–15.5)
WBC: 4.8 10*3/uL (ref 4.0–10.5)
nRBC: 0 % (ref 0.0–0.2)

## 2021-06-19 LAB — COMPREHENSIVE METABOLIC PANEL
ALT: 15 U/L (ref 0–44)
AST: 17 U/L (ref 15–41)
Albumin: 3.6 g/dL (ref 3.5–5.0)
Alkaline Phosphatase: 77 U/L (ref 38–126)
Anion gap: 4 — ABNORMAL LOW (ref 5–15)
BUN: 14 mg/dL (ref 8–23)
CO2: 27 mmol/L (ref 22–32)
Calcium: 9.4 mg/dL (ref 8.9–10.3)
Chloride: 108 mmol/L (ref 98–111)
Creatinine, Ser: 1.09 mg/dL (ref 0.61–1.24)
GFR, Estimated: >60 mL/min (ref >60)
Glucose, Bld: 87 mg/dL (ref 70–99)
Potassium: 4.1 mmol/L (ref 3.5–5.1)
Sodium: 139 mmol/L (ref 135–145)
Total Bilirubin: 0.4 mg/dL (ref 0.3–1.2)
Total Protein: 6.5 g/dL (ref 6.5–8.1)

## 2021-06-19 LAB — SURGICAL PCR SCREEN
MRSA, PCR: NEGATIVE
Staphylococcus aureus: NEGATIVE

## 2021-06-19 LAB — HEMOGLOBIN A1C
Hgb A1c MFr Bld: 5.8 % — ABNORMAL HIGH (ref 4.8–5.6)
Mean Plasma Glucose: 119.76 mg/dL

## 2021-06-19 LAB — GLUCOSE, CAPILLARY: Glucose-Capillary: 103 mg/dL — ABNORMAL HIGH (ref 70–99)

## 2021-06-19 NOTE — Progress Notes (Addendum)
Anesthesia Review:  PCP: DR Windy Fast Polite  LOV 02/24/21  Presurgery clearance appt dated 06/16/2021 on chart  Cardiologist : DR Bryan Lemma  LOV 12/23/20- Hao Meng,PA  Chest x-ray : EKG : 12/23/20  Echo : Stress test: 2020  Cardiac Cath :  Activity level: can doa o flight of stairs without difficulty  Sleep Study/ CPAP : none  Fasting Blood Sugar :      / Checks Blood Sugar -- times a day:   Blood Thinner/ Instructions /Last Dose: ASA / Instructions/ Last Dose :   81 mg Aspirin  Jessica Ward mPA made aware of heart rate of 42 at preop.  Most recent ekg 12/23/20 heart rate 48.  PT states " I run in the 40s and 50s.  The other day at MD office I was 54.  PT denies any chest pain, dizziness, or lightheadedness.  No new orders given.  No covid test- PT positive for home test on 05/01/21.  In touch with DR [name] .  Placed on antiviral.  Have called office of DR [name] to have documentation sent.  Rerequested documentation on positive home covid test and antiviral on 06/22/21.   Covid documentation from Dr [name] office dated 05/05/21 on chart  Prediabetes - hgba1c-06/19/21-  Hgba1c-06/19/21-5.8

## 2021-06-25 ENCOUNTER — Observation Stay (HOSPITAL_COMMUNITY)
Admission: RE | Admit: 2021-06-25 | Discharge: 2021-06-26 | Disposition: A | Discharge status: Home / Self Care | Payer: BC Managed Care – PPO | Source: Ambulatory Visit | Attending: Orthopedic Surgery | Admitting: Orthopedic Surgery

## 2021-06-25 ENCOUNTER — Other Ambulatory Visit: Payer: Self-pay

## 2021-06-25 ENCOUNTER — Ambulatory Visit (HOSPITAL_COMMUNITY): Payer: BC Managed Care – PPO | Admitting: Physician Assistant

## 2021-06-25 ENCOUNTER — Encounter (HOSPITAL_COMMUNITY): Payer: Self-pay | Admitting: Orthopedic Surgery

## 2021-06-25 ENCOUNTER — Encounter (HOSPITAL_COMMUNITY)
Admission: RE | Disposition: A | Discharge status: Home / Self Care | Payer: Self-pay | Source: Ambulatory Visit | Attending: Orthopedic Surgery

## 2021-06-25 ENCOUNTER — Ambulatory Visit (HOSPITAL_COMMUNITY): Payer: BC Managed Care – PPO | Admitting: Registered Nurse

## 2021-06-25 DIAGNOSIS — Z791 Long term (current) use of non-steroidal anti-inflammatories (NSAID): Secondary | ICD-10-CM | POA: Insufficient documentation

## 2021-06-25 DIAGNOSIS — Z7982 Long term (current) use of aspirin: Secondary | ICD-10-CM | POA: Diagnosis not present

## 2021-06-25 DIAGNOSIS — M1712 Unilateral primary osteoarthritis, left knee: Secondary | ICD-10-CM | POA: Diagnosis not present

## 2021-06-25 DIAGNOSIS — M170 Bilateral primary osteoarthritis of knee: Principal | ICD-10-CM | POA: Insufficient documentation

## 2021-06-25 DIAGNOSIS — G8918 Other acute postprocedural pain: Secondary | ICD-10-CM | POA: Diagnosis not present

## 2021-06-25 DIAGNOSIS — Z79899 Other long term (current) drug therapy: Secondary | ICD-10-CM | POA: Insufficient documentation

## 2021-06-25 DIAGNOSIS — Z96652 Presence of left artificial knee joint: Secondary | ICD-10-CM

## 2021-06-25 DIAGNOSIS — I10 Essential (primary) hypertension: Secondary | ICD-10-CM | POA: Diagnosis not present

## 2021-06-25 HISTORY — PX: TOTAL KNEE ARTHROPLASTY: SHX125

## 2021-06-25 LAB — GLUCOSE, CAPILLARY: Glucose-Capillary: 109 mg/dL — ABNORMAL HIGH (ref 70–99)

## 2021-06-25 LAB — TYPE AND SCREEN
ABO/RH(D): B POS
Antibody Screen: NEGATIVE

## 2021-06-25 LAB — ABO/RH: ABO/RH(D): B POS

## 2021-06-25 SURGERY — ARTHROPLASTY, KNEE, TOTAL
Anesthesia: Spinal | Site: Knee | Laterality: Left

## 2021-06-25 MED ORDER — SODIUM CHLORIDE 0.9 % IR SOLN
Status: DC | PRN
  Administered 2021-06-25: 1000 mL

## 2021-06-25 MED ORDER — FENTANYL CITRATE PF 50 MCG/ML IJ SOSY
50.0000 ug | PREFILLED_SYRINGE | INTRAMUSCULAR | Status: DC
Start: 2021-06-25 — End: 2021-06-25
  Administered 2021-06-25: 50 ug via INTRAVENOUS
  Filled 2021-06-25: qty 2

## 2021-06-25 MED ORDER — NEBIVOLOL HCL 2.5 MG PO TABS
2.5000 mg | ORAL_TABLET | Freq: Every day | ORAL | Status: DC
  Administered 2021-06-26: 2.5 mg via ORAL
  Filled 2021-06-25: qty 1

## 2021-06-25 MED ORDER — SODIUM CHLORIDE (PF) 0.9 % IJ SOLN
INTRAMUSCULAR | Status: AC
  Filled 2021-06-25: qty 30

## 2021-06-25 MED ORDER — 0.9 % SODIUM CHLORIDE (POUR BTL) OPTIME
TOPICAL | Status: DC | PRN
  Administered 2021-06-25: 1000 mL

## 2021-06-25 MED ORDER — FERROUS SULFATE 325 (65 FE) MG PO TABS
325.0000 mg | ORAL_TABLET | Freq: Three times a day (TID) | ORAL | Status: DC
  Administered 2021-06-25 – 2021-06-26 (×3): 325 mg via ORAL
  Filled 2021-06-25 (×3): qty 1

## 2021-06-25 MED ORDER — OXYCODONE HCL 5 MG PO TABS
5.0000 mg | ORAL_TABLET | ORAL | Status: DC | PRN
  Administered 2021-06-25 – 2021-06-26 (×3): 10 mg via ORAL
  Filled 2021-06-25 (×3): qty 2

## 2021-06-25 MED ORDER — POVIDONE-IODINE 10 % EX SWAB
2.0000 "application " | Freq: Once | CUTANEOUS | Status: AC
  Administered 2021-06-25: 2 via TOPICAL

## 2021-06-25 MED ORDER — DEXAMETHASONE SODIUM PHOSPHATE 10 MG/ML IJ SOLN
10.0000 mg | Freq: Once | INTRAMUSCULAR | Status: AC
  Administered 2021-06-26: 10 mg via INTRAVENOUS
  Filled 2021-06-25: qty 1

## 2021-06-25 MED ORDER — BUPIVACAINE-EPINEPHRINE (PF) 0.25% -1:200000 IJ SOLN
INTRAMUSCULAR | Status: AC
  Filled 2021-06-25: qty 30

## 2021-06-25 MED ORDER — PROPOFOL 1000 MG/100ML IV EMUL
INTRAVENOUS | Status: AC
  Filled 2021-06-25: qty 100

## 2021-06-25 MED ORDER — PHENYLEPHRINE HCL-NACL 20-0.9 MG/250ML-% IV SOLN
INTRAVENOUS | Status: DC | PRN
  Administered 2021-06-25: 25 ug/min via INTRAVENOUS

## 2021-06-25 MED ORDER — ONDANSETRON HCL 4 MG/2ML IJ SOLN
INTRAMUSCULAR | Status: AC
  Filled 2021-06-25: qty 2

## 2021-06-25 MED ORDER — CARBOXYMETHYLCELLULOSE SODIUM 0.5 % OP SOLN: 1.0000 [drp] | Freq: Every day | OPHTHALMIC | Status: DC | PRN

## 2021-06-25 MED ORDER — TRANEXAMIC ACID-NACL 1000-0.7 MG/100ML-% IV SOLN
1000.0000 mg | INTRAVENOUS | Status: AC
  Administered 2021-06-25: 1000 mg via INTRAVENOUS
  Filled 2021-06-25: qty 100

## 2021-06-25 MED ORDER — ONDANSETRON HCL 4 MG/2ML IJ SOLN: 4.0000 mg | Freq: Once | INTRAMUSCULAR | Status: DC | PRN

## 2021-06-25 MED ORDER — ACETAMINOPHEN 10 MG/ML IV SOLN: 1000.0000 mg | Freq: Once | INTRAVENOUS | Status: DC | PRN

## 2021-06-25 MED ORDER — SODIUM CHLORIDE 0.9 % IV SOLN: INTRAVENOUS | Status: DC

## 2021-06-25 MED ORDER — POLYETHYLENE GLYCOL 3350 17 G PO PACK: 17.0000 g | PACK | Freq: Every day | ORAL | Status: DC | PRN

## 2021-06-25 MED ORDER — PHENOL 1.4 % MT LIQD: 1.0000 | OROMUCOSAL | Status: DC | PRN

## 2021-06-25 MED ORDER — RIVAROXABAN 10 MG PO TABS
10.0000 mg | ORAL_TABLET | Freq: Every day | ORAL | Status: DC
  Administered 2021-06-26: 10 mg via ORAL
  Filled 2021-06-25: qty 1

## 2021-06-25 MED ORDER — PROPOFOL 500 MG/50ML IV EMUL
INTRAVENOUS | Status: DC | PRN
  Administered 2021-06-25: 40 ug/kg/min via INTRAVENOUS

## 2021-06-25 MED ORDER — POLYVINYL ALCOHOL 1.4 % OP SOLN: 1.0000 [drp] | OPHTHALMIC | Status: DC | PRN

## 2021-06-25 MED ORDER — PHENYLEPHRINE HCL-NACL 20-0.9 MG/250ML-% IV SOLN
INTRAVENOUS | Status: AC
  Filled 2021-06-25: qty 250

## 2021-06-25 MED ORDER — ACETAMINOPHEN 325 MG PO TABS: 325.0000 mg | ORAL_TABLET | Freq: Four times a day (QID) | ORAL | Status: DC | PRN

## 2021-06-25 MED ORDER — CHLORHEXIDINE GLUCONATE CLOTH 2 % EX PADS
6.0000 | MEDICATED_PAD | Freq: Every day | CUTANEOUS | Status: DC
  Administered 2021-06-26: 6 via TOPICAL

## 2021-06-25 MED ORDER — LACTATED RINGERS IV SOLN: INTRAVENOUS | Status: DC

## 2021-06-25 MED ORDER — METOCLOPRAMIDE HCL 5 MG PO TABS: 5.0000 mg | ORAL_TABLET | Freq: Three times a day (TID) | ORAL | Status: DC | PRN

## 2021-06-25 MED ORDER — GABAPENTIN 100 MG PO CAPS
100.0000 mg | ORAL_CAPSULE | Freq: Every day | ORAL | Status: DC
  Administered 2021-06-25: 100 mg via ORAL
  Filled 2021-06-25: qty 1

## 2021-06-25 MED ORDER — METHOCARBAMOL 500 MG IVPB - SIMPLE MED
500.0000 mg | Freq: Four times a day (QID) | INTRAVENOUS | Status: DC | PRN
  Filled 2021-06-25: qty 50

## 2021-06-25 MED ORDER — ONDANSETRON HCL 4 MG/2ML IJ SOLN
INTRAMUSCULAR | Status: DC | PRN
  Administered 2021-06-25: 4 mg via INTRAVENOUS

## 2021-06-25 MED ORDER — BUPIVACAINE HCL (PF) 0.5 % IJ SOLN
INTRAMUSCULAR | Status: DC | PRN
  Administered 2021-06-25: 20 mL via PERINEURAL

## 2021-06-25 MED ORDER — CEFAZOLIN SODIUM-DEXTROSE 2-4 GM/100ML-% IV SOLN
2.0000 g | INTRAVENOUS | Status: AC
  Administered 2021-06-25: 2 g via INTRAVENOUS
  Filled 2021-06-25: qty 100

## 2021-06-25 MED ORDER — CEFAZOLIN SODIUM-DEXTROSE 2-4 GM/100ML-% IV SOLN
2.0000 g | Freq: Four times a day (QID) | INTRAVENOUS | Status: AC
Start: 2021-06-25 — End: 2021-06-25
  Administered 2021-06-25 (×2): 2 g via INTRAVENOUS
  Filled 2021-06-25 (×2): qty 100

## 2021-06-25 MED ORDER — ONDANSETRON HCL 4 MG/2ML IJ SOLN: 4.0000 mg | Freq: Four times a day (QID) | INTRAMUSCULAR | Status: DC | PRN

## 2021-06-25 MED ORDER — ONDANSETRON HCL 4 MG PO TABS: 4.0000 mg | ORAL_TABLET | Freq: Four times a day (QID) | ORAL | Status: DC | PRN

## 2021-06-25 MED ORDER — STERILE WATER FOR IRRIGATION IR SOLN
Status: DC | PRN
  Administered 2021-06-25: 2000 mL

## 2021-06-25 MED ORDER — METOCLOPRAMIDE HCL 5 MG/ML IJ SOLN: 5.0000 mg | Freq: Three times a day (TID) | INTRAMUSCULAR | Status: DC | PRN

## 2021-06-25 MED ORDER — CELECOXIB 200 MG PO CAPS
200.0000 mg | ORAL_CAPSULE | Freq: Two times a day (BID) | ORAL | Status: DC
  Administered 2021-06-25 – 2021-06-26 (×2): 200 mg via ORAL
  Filled 2021-06-25 (×2): qty 1

## 2021-06-25 MED ORDER — DIPHENHYDRAMINE HCL 12.5 MG/5ML PO ELIX: 12.5000 mg | ORAL_SOLUTION | ORAL | Status: DC | PRN

## 2021-06-25 MED ORDER — DEXAMETHASONE SODIUM PHOSPHATE 10 MG/ML IJ SOLN
8.0000 mg | Freq: Once | INTRAMUSCULAR | Status: AC
  Administered 2021-06-25: 8 mg via INTRAVENOUS

## 2021-06-25 MED ORDER — TRANEXAMIC ACID-NACL 1000-0.7 MG/100ML-% IV SOLN
1000.0000 mg | Freq: Once | INTRAVENOUS | Status: AC
  Administered 2021-06-25: 1000 mg via INTRAVENOUS
  Filled 2021-06-25: qty 100

## 2021-06-25 MED ORDER — OXYCODONE HCL 5 MG PO TABS: 5.0000 mg | ORAL_TABLET | Freq: Once | ORAL | Status: DC | PRN

## 2021-06-25 MED ORDER — DEXAMETHASONE SODIUM PHOSPHATE 10 MG/ML IJ SOLN
INTRAMUSCULAR | Status: AC
  Filled 2021-06-25: qty 1

## 2021-06-25 MED ORDER — MENTHOL 3 MG MT LOZG: 1.0000 | LOZENGE | OROMUCOSAL | Status: DC | PRN

## 2021-06-25 MED ORDER — BUPIVACAINE IN DEXTROSE 0.75-8.25 % IT SOLN
INTRATHECAL | Status: DC | PRN
  Administered 2021-06-25: 1.6 mL via INTRATHECAL

## 2021-06-25 MED ORDER — HYDROMORPHONE HCL 1 MG/ML IJ SOLN
0.5000 mg | INTRAMUSCULAR | Status: DC | PRN
  Administered 2021-06-25: 1 mg via INTRAVENOUS
  Filled 2021-06-25: qty 1

## 2021-06-25 MED ORDER — FUROSEMIDE 20 MG PO TABS
20.0000 mg | ORAL_TABLET | Freq: Every day | ORAL | Status: DC
  Administered 2021-06-26: 20 mg via ORAL
  Filled 2021-06-25: qty 1

## 2021-06-25 MED ORDER — MIDAZOLAM HCL 2 MG/2ML IJ SOLN
1.0000 mg | INTRAMUSCULAR | Status: DC
  Administered 2021-06-25: 2 mg via INTRAVENOUS
  Filled 2021-06-25: qty 2

## 2021-06-25 MED ORDER — BISACODYL 10 MG RE SUPP: 10.0000 mg | Freq: Every day | RECTAL | Status: DC | PRN

## 2021-06-25 MED ORDER — METHOCARBAMOL 500 MG PO TABS
500.0000 mg | ORAL_TABLET | Freq: Four times a day (QID) | ORAL | Status: DC | PRN
  Administered 2021-06-25: 500 mg via ORAL
  Filled 2021-06-25: qty 1

## 2021-06-25 MED ORDER — KETAMINE HCL 10 MG/ML IJ SOLN
INTRAMUSCULAR | Status: AC
  Filled 2021-06-25: qty 1

## 2021-06-25 MED ORDER — DOCUSATE SODIUM 100 MG PO CAPS
100.0000 mg | ORAL_CAPSULE | Freq: Two times a day (BID) | ORAL | Status: DC
  Administered 2021-06-25 – 2021-06-26 (×2): 100 mg via ORAL
  Filled 2021-06-25 (×2): qty 1

## 2021-06-25 MED ORDER — OXYCODONE HCL 5 MG/5ML PO SOLN: 5.0000 mg | Freq: Once | ORAL | Status: DC | PRN

## 2021-06-25 MED ORDER — KETOROLAC TROMETHAMINE 30 MG/ML IJ SOLN
INTRAMUSCULAR | Status: AC
  Filled 2021-06-25: qty 1

## 2021-06-25 MED ORDER — TADALAFIL 5 MG PO TABS: 5.0000 mg | ORAL_TABLET | Freq: Every day | ORAL | Status: DC

## 2021-06-25 MED ORDER — BUPIVACAINE-EPINEPHRINE (PF) 0.25% -1:200000 IJ SOLN
INTRAMUSCULAR | Status: DC | PRN
  Administered 2021-06-25: 30 mL

## 2021-06-25 MED ORDER — SODIUM CHLORIDE (PF) 0.9 % IJ SOLN
INTRAMUSCULAR | Status: DC | PRN
  Administered 2021-06-25: 30 mL

## 2021-06-25 MED ORDER — KETAMINE HCL 10 MG/ML IJ SOLN
INTRAMUSCULAR | Status: DC | PRN
Start: 2021-06-25 — End: 2021-06-25
  Administered 2021-06-25: 30 mg via INTRAVENOUS

## 2021-06-25 MED ORDER — HYDROMORPHONE HCL 1 MG/ML IJ SOLN: 0.2500 mg | INTRAMUSCULAR | Status: DC | PRN

## 2021-06-25 MED ORDER — OXYCODONE HCL 5 MG PO TABS: 10.0000 mg | ORAL_TABLET | ORAL | Status: DC | PRN

## 2021-06-25 MED ORDER — KETOROLAC TROMETHAMINE 30 MG/ML IJ SOLN
INTRAMUSCULAR | Status: DC | PRN
  Administered 2021-06-25: 30 mg

## 2021-06-25 SURGICAL SUPPLY — 61 items
ADH SKN CLS APL DERMABOND .7 (GAUZE/BANDAGES/DRESSINGS) ×1
ATTUNE MED ANAT PAT 38 KNEE (Knees) ×1 IMPLANT
ATTUNE PS FEM LT SZ 5 CEM KNEE (Femur) ×1 IMPLANT
BAG COUNTER SPONGE SURGICOUNT (BAG) IMPLANT
BAG SPEC THK2 15X12 ZIP CLS (MISCELLANEOUS)
BAG SPNG CNTER NS LX DISP (BAG)
BAG ZIPLOCK 12X15 (MISCELLANEOUS) IMPLANT
BASE TIBIA ATTUNE KNEE SYS SZ6 (Knees) IMPLANT
BLADE SAW SGTL 11.0X1.19X90.0M (BLADE) IMPLANT
BLADE SAW SGTL 13.0X1.19X90.0M (BLADE) ×2 IMPLANT
BLADE SURG SZ10 CARB STEEL (BLADE) ×4 IMPLANT
BNDG ELASTIC 6X5.8 VLCR STR LF (GAUZE/BANDAGES/DRESSINGS) ×2 IMPLANT
BOWL SMART MIX CTS (DISPOSABLE) ×2 IMPLANT
BSPLAT TIB 6 CMNT ROT PLAT STR (Knees) ×1 IMPLANT
CATH COUDE 5CC RIBBED (CATHETERS) IMPLANT
CATH FOLEY 2WAY 5CC 16FR (CATHETERS) ×2
CATH FOLEY 2WAY SLVR  5CC 16FR (CATHETERS) ×2
CATH FOLEY 2WAY SLVR 5CC 16FR (CATHETERS) IMPLANT
CATH RIBBED COUDE 5CC (CATHETERS) ×2
CATH URTH STD 16FR FL 2W DRN (CATHETERS) IMPLANT
CEMENT HV SMART SET (Cement) ×2 IMPLANT
CUFF TOURN SGL QUICK 34 (TOURNIQUET CUFF) ×2
CUFF TRNQT CYL 34X4.125X (TOURNIQUET CUFF) ×1 IMPLANT
DECANTER SPIKE VIAL GLASS SM (MISCELLANEOUS) ×4 IMPLANT
DERMABOND ADVANCED (GAUZE/BANDAGES/DRESSINGS) ×1
DERMABOND ADVANCED .7 DNX12 (GAUZE/BANDAGES/DRESSINGS) ×1 IMPLANT
DRAPE INCISE IOBAN 66X45 STRL (DRAPES) ×6 IMPLANT
DRAPE U-SHAPE 47X51 STRL (DRAPES) ×2 IMPLANT
DRESSING AQUACEL AG SP 3.5X10 (GAUZE/BANDAGES/DRESSINGS) ×1 IMPLANT
DRSG AQUACEL AG ADV 3.5X10 (GAUZE/BANDAGES/DRESSINGS) ×1 IMPLANT
DRSG AQUACEL AG SP 3.5X10 (GAUZE/BANDAGES/DRESSINGS) ×2
DURAPREP 26ML APPLICATOR (WOUND CARE) ×4 IMPLANT
ELECT REM PT RETURN 15FT ADLT (MISCELLANEOUS) ×2 IMPLANT
GLOVE SURG ENC MOIS LTX SZ6 (GLOVE) ×2 IMPLANT
GLOVE SURG ENC MOIS LTX SZ7 (GLOVE) ×2 IMPLANT
GLOVE SURG POLY ORTHO LF SZ6.5 (GLOVE) ×2 IMPLANT
GLOVE SURG UNDER POLY LF SZ7.5 (GLOVE) ×2 IMPLANT
GOWN STRL REUS W/TWL LRG LVL3 (GOWN DISPOSABLE) ×2 IMPLANT
HANDPIECE INTERPULSE COAX TIP (DISPOSABLE) ×2
HOLDER FOLEY CATH W/STRAP (MISCELLANEOUS) IMPLANT
INSERT TIB ATTUNE RP SZ5X14 (Insert) ×1 IMPLANT
KIT TURNOVER KIT A (KITS) ×2 IMPLANT
MANIFOLD NEPTUNE II (INSTRUMENTS) ×2 IMPLANT
NDL SAFETY ECLIPSE 18X1.5 (NEEDLE) IMPLANT
NEEDLE HYPO 18GX1.5 SHARP (NEEDLE)
NS IRRIG 1000ML POUR BTL (IV SOLUTION) ×2 IMPLANT
PACK TOTAL KNEE CUSTOM (KITS) ×2 IMPLANT
PROTECTOR NERVE ULNAR (MISCELLANEOUS) ×2 IMPLANT
SET HNDPC FAN SPRY TIP SCT (DISPOSABLE) ×1 IMPLANT
SET PAD KNEE POSITIONER (MISCELLANEOUS) ×2 IMPLANT
SUT MNCRL AB 4-0 PS2 18 (SUTURE) ×2 IMPLANT
SUT STRATAFIX PDS+ 0 24IN (SUTURE) ×2 IMPLANT
SUT VIC AB 1 CT1 36 (SUTURE) ×2 IMPLANT
SUT VIC AB 2-0 CT1 27 (SUTURE) ×6
SUT VIC AB 2-0 CT1 TAPERPNT 27 (SUTURE) ×3 IMPLANT
SYR 3ML LL SCALE MARK (SYRINGE) ×2 IMPLANT
TIBIA ATTUNE KNEE SYS BASE SZ6 (Knees) ×2 IMPLANT
TRAY FOLEY MTR SLVR 16FR STAT (SET/KITS/TRAYS/PACK) ×2 IMPLANT
TUBE SUCTION HIGH CAP CLEAR NV (SUCTIONS) ×2 IMPLANT
WATER STERILE IRR 1000ML POUR (IV SOLUTION) ×4 IMPLANT
WRAP KNEE MAXI GEL POST OP (GAUZE/BANDAGES/DRESSINGS) ×2 IMPLANT

## 2021-06-25 NOTE — Anesthesia Procedure Notes (Signed)
Anesthesia Procedure Image    

## 2021-06-25 NOTE — Anesthesia Procedure Notes (Signed)
Anesthesia Regional Block: Adductor canal block   Pre-Anesthetic Checklist: , timeout performed,  Correct Patient, Correct Site, Correct Laterality,  Correct Procedure, Correct Position, site marked,  Risks and benefits discussed,  Surgical consent,  Pre-op evaluation,  At surgeon's request and post-op pain management  Laterality: Left  Prep: chloraprep       Needles:  Injection technique: Single-shot  Needle Type: Echogenic Needle     Needle Length: 9cm      Additional Needles:   Procedures:,,,, ultrasound used (permanent image in chart),,    Narrative:  Start time: 06/25/2021 9:30 AM End time: 06/25/2021 9:36 AM Injection made incrementally with aspirations every 5 mL.  Performed by: Personally  Anesthesiologist: Eilene Ghazi, MD  Additional Notes: Patient tolerated the procedure well without complications

## 2021-06-25 NOTE — Evaluation (Signed)
Physical Therapy Evaluation Patient Details Name: Craig [name] MRN: [id number] DOB: March 22, 1951 Today's Date: 06/25/2021  History of Present Illness  Patient is 70 y.o. 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    Clinical Impression  Craig [name] is a 70 y.o. male POD 0 s/p Lt TKA. Patient reports independence with mobility at baseline. Patient is now limited by functional impairments (see PT problem list below) and requires min assist for transfers and gait with RW. Patient was able to ambulate ~50 feet with RW and min assist. Patient instructed in exercise to facilitate circulation to manage edema and reduce risk of DVT. Patient will benefit from continued skilled PT interventions to address impairments and progress towards PLOF. Acute PT will follow to progress mobility and stair training in preparation for safe discharge home.        Recommendations for follow up therapy are one component of a multi-disciplinary discharge planning process, led by the attending physician.  Recommendations may be updated based on patient status, additional functional criteria and insurance authorization.  Follow Up Recommendations Follow surgeon's recommendation for DC plan and follow-up therapies    Equipment Recommendations  None recommended by PT    Recommendations for Other Services       Precautions / Restrictions Precautions Precautions: Fall Restrictions Weight Bearing Restrictions: No Other Position/Activity Restrictions: WBAT      Mobility  Bed Mobility Overal bed mobility: Needs Assistance Bed Mobility: Supine to Sit     Supine to sit: Min assist     General bed mobility comments: cues to use bed rail and min assist to pivot hip with use of bed pad and to raise trunk.    Transfers Overall transfer level: Needs assistance Equipment used: Rolling walker (2 wheeled) Transfers: Sit to/from Stand Sit to Stand: Min assist         General  transfer comment: cues for hand placement to rise from EOB, min assist for power up  Ambulation/Gait Ambulation/Gait assistance: Min assist Gait Distance (Feet): 50 Feet Assistive device: Rolling walker (2 wheeled) Gait Pattern/deviations: Step-to pattern;Decreased stride length;Shuffle Gait velocity: decr   General Gait Details: cues for step to pattern and proximity to RW. no overt LOB or buckling at Lt knee. Assist needed to manage walker position as pt has tendency to step too close to front bar.  Stairs            Wheelchair Mobility    Modified Rankin (Stroke Patients Only)       Balance Overall balance assessment: Needs assistance Sitting-balance support: Feet supported Sitting balance-Leahy Scale: Good     Standing balance support: During functional activity;Bilateral upper extremity supported Standing balance-Leahy Scale: Fair                               Pertinent Vitals/Pain Pain Assessment: 0-10 Pain Score: 6  Pain Location: Lt knee Pain Descriptors / Indicators: Aching;Discomfort Pain Intervention(s): Limited activity within patient's tolerance;Monitored during session;Repositioned;Ice applied    Home Living Family/patient expects to be discharged to:: Private residence Living Arrangements: Spouse/significant other Available Help at Discharge: Family Type of Home: House Home Access: Stairs to enter Entrance Stairs-Rails: None Entrance Stairs-Number of Steps: 4 Home Layout: One level Home Equipment: Environmental consultant - 2 wheels;Cane - single point      Prior Function Level of Independence: Independent  Hand Dominance   Dominant Hand: Right    Extremity/Trunk Assessment   Upper Extremity Assessment Upper Extremity Assessment: Overall WFL for tasks assessed    Lower Extremity Assessment Lower Extremity Assessment: LLE deficits/detail LLE Deficits / Details: good quad activation, no extensor lag with SLR LLE  Sensation: WNL LLE Coordination: WNL    Cervical / Trunk Assessment Cervical / Trunk Assessment: Normal  Communication   Communication: No difficulties  Cognition Arousal/Alertness: Awake/alert Behavior During Therapy: WFL for tasks assessed/performed Overall Cognitive Status: Within Functional Limits for tasks assessed                                        General Comments      Exercises Total Joint Exercises Ankle Circles/Pumps: AROM;Both;20 reps;Seated   Assessment/Plan    PT Assessment Patient needs continued PT services  PT Problem List Decreased strength;Decreased range of motion;Decreased activity tolerance;Decreased balance;Decreased mobility;Decreased knowledge of use of DME;Decreased knowledge of precautions;Pain       PT Treatment Interventions DME instruction;Gait training;Stair training;Functional mobility training;Therapeutic activities;Therapeutic exercise;Balance training;Patient/family education    PT Goals (Current goals can be found in the Care Plan section)  Acute Rehab PT Goals Patient Stated Goal: get more active PT Goal Formulation: With patient Time For Goal Achievement: 07/02/21 Potential to Achieve Goals: Good    Frequency 7X/week   Barriers to discharge        Co-evaluation               AM-PAC PT "6 Clicks" Mobility  Outcome Measure Help needed turning from your back to your side while in a flat bed without using bedrails?: A Little Help needed moving from lying on your back to sitting on the side of a flat bed without using bedrails?: A Little Help needed moving to and from a bed to a chair (including a wheelchair)?: A Little Help needed standing up from a chair using your arms (e.g., wheelchair or bedside chair)?: A Little Help needed to walk in hospital room?: A Little Help needed climbing 3-5 steps with a railing? : A Little 6 Click Score: 18    End of Session Equipment Utilized During Treatment: Gait  belt Activity Tolerance: Patient tolerated treatment well Patient left: in chair;with call bell/phone within reach;with chair alarm set;with family/visitor present Nurse Communication: Mobility status PT Visit Diagnosis: Muscle weakness (generalized) (M62.81);Difficulty in walking, not elsewhere classified (R26.2)    Time: 7741-4239 PT Time Calculation (min) (ACUTE ONLY): 19 min   Charges:   PT Evaluation $PT Eval Low Complexity: 1 Low          [name], DPT Acute Rehabilitation Services Office [phone] Pager [phone]   [name] 06/25/2021, 6:17 PM

## 2021-06-25 NOTE — Plan of Care (Signed)
  Problem: Pain Managment: Goal: General experience of comfort will improve Outcome: Progressing   Problem: Safety: Goal: Ability to remain free from injury will improve Outcome: Progressing   

## 2021-06-25 NOTE — Discharge Instructions (Addendum)
INSTRUCTIONS AFTER JOINT REPLACEMENT   Remove items at home which could result in a fall. This includes throw rugs or furniture in walking pathways ICE to the affected joint every three hours while awake for 30 minutes at a time, for at least the first 3-5 days, and then as needed for pain and swelling.  Continue to use ice for pain and swelling. You may notice swelling that will progress down to the foot and ankle.  This is normal after surgery.  Elevate your leg when you are not up walking on it.   Continue to use the breathing machine you got in the hospital (incentive spirometer) which will help keep your temperature down.  It is common for your temperature to cycle up and down following surgery, especially at night when you are not up moving around and exerting yourself.  The breathing machine keeps your lungs expanded and your temperature down.   DIET:  As you were doing prior to hospitalization, we recommend a well-balanced diet.  DRESSING / WOUND CARE / SHOWERING  Keep the surgical dressing until follow up.  The dressing is water proof, so you can shower without any extra covering.  IF THE DRESSING FALLS OFF or the wound gets wet inside, change the dressing with sterile gauze.  Please use good hand washing techniques before changing the dressing.  Do not use any lotions or creams on the incision until instructed by your surgeon.    ACTIVITY  Increase activity slowly as tolerated, but follow the weight bearing instructions below.   No driving for 6 weeks or until further direction given by your physician.  You cannot drive while taking narcotics.  No lifting or carrying greater than 10 lbs. until further directed by your surgeon. Avoid periods of inactivity such as sitting longer than an hour when not asleep. This helps prevent blood clots.  You may return to work once you are authorized by your doctor.     WEIGHT BEARING   Weight bearing as tolerated with assist device (walker, cane,  etc) as directed, use it as long as suggested by your surgeon or therapist, typically at least 4-6 weeks.   EXERCISES  Results after joint replacement surgery are often greatly improved when you follow the exercise, range of motion and muscle strengthening exercises prescribed by your doctor. Safety measures are also important to protect the joint from further injury. Any time any of these exercises cause you to have increased pain or swelling, decrease what you are doing until you are comfortable again and then slowly increase them. If you have problems or questions, call your caregiver or physical therapist for advice.   Rehabilitation is important following a joint replacement. After just a few days of immobilization, the muscles of the leg can become weakened and shrink (atrophy).  These exercises are designed to build up the tone and strength of the thigh and leg muscles and to improve motion. Often times heat used for twenty to thirty minutes before working out will loosen up your tissues and help with improving the range of motion but do not use heat for the first two weeks following surgery (sometimes heat can increase post-operative swelling).   These exercises can be done on a training (exercise) mat, on the floor, on a table or on a bed. Use whatever works the best and is most comfortable for you.    Use music or television while you are exercising so that the exercises are a pleasant break in your   day. This will make your life better with the exercises acting as a break in your routine that you can look forward to.   Perform all exercises about fifteen times, three times per day or as directed.  You should exercise both the operative leg and the other leg as well.  Exercises include:   Quad Sets - Tighten up the muscle on the front of the thigh (Quad) and hold for 5-10 seconds.   Straight Leg Raises - With your knee straight (if you were given a brace, keep it on), lift the leg to 60  degrees, hold for 3 seconds, and slowly lower the leg.  Perform this exercise against resistance later as your leg gets stronger.  Leg Slides: Lying on your back, slowly slide your foot toward your buttocks, bending your knee up off the floor (only go as far as is comfortable). Then slowly slide your foot back down until your leg is flat on the floor again.  Angel Wings: Lying on your back spread your legs to the side as far apart as you can without causing discomfort.  Hamstring Strength:  Lying on your back, push your heel against the floor with your leg straight by tightening up the muscles of your buttocks.  Repeat, but this time bend your knee to a comfortable angle, and push your heel against the floor.  You may put a pillow under the heel to make it more comfortable if necessary.   A rehabilitation program following joint replacement surgery can speed recovery and prevent re-injury in the future due to weakened muscles. Contact your doctor or a physical therapist for more information on knee rehabilitation.    CONSTIPATION  Constipation is defined medically as fewer than three stools per week and severe constipation as less than one stool per week.  Even if you have a regular bowel pattern at home, your normal regimen is likely to be disrupted due to multiple reasons following surgery.  Combination of anesthesia, postoperative narcotics, change in appetite and fluid intake all can affect your bowels.   YOU MUST use at least one of the following options; they are listed in order of increasing strength to get the job done.  They are all available over the counter, and you may need to use some, POSSIBLY even all of these options:    Drink plenty of fluids (prune juice may be helpful) and high fiber foods Colace 100 mg by mouth twice a day  Senokot for constipation as directed and as needed Dulcolax (bisacodyl), take with full glass of water  Miralax (polyethylene glycol) once or twice a day as  needed.  If you have tried all these things and are unable to have a bowel movement in the first 3-4 days after surgery call either your surgeon or your primary doctor.    If you experience loose stools or diarrhea, hold the medications until you stool forms back up.  If your symptoms do not get better within 1 week or if they get worse, check with your doctor.  If you experience "the worst abdominal pain ever" or develop nausea or vomiting, please contact the office immediately for further recommendations for treatment.   ITCHING:  If you experience itching with your medications, try taking only a single pain pill, or even half a pain pill at a time.  You can also use Benadryl over the counter for itching or also to help with sleep.   TED HOSE STOCKINGS:  Use stockings on both   legs until for at least 2 weeks or as directed by physician office. They may be removed at night for sleeping.  MEDICATIONS:  See your medication summary on the "After Visit Summary" that nursing will review with you.  You may have some home medications which will be placed on hold until you complete the course of blood thinner medication.  It is important for you to complete the blood thinner medication as prescribed.  PRECAUTIONS:  If you experience chest pain or shortness of breath - call 911 immediately for transfer to the hospital emergency department.   If you develop a fever greater that 101 F, purulent drainage from wound, increased redness or drainage from wound, foul odor from the wound/dressing, or calf pain - CONTACT YOUR SURGEON.                                                   FOLLOW-UP APPOINTMENTS:  If you do not already have a post-op appointment, please call the office for an appointment to be seen by your surgeon.  Guidelines for how soon to be seen are listed in your "After Visit Summary", but are typically between 1-4 weeks after surgery.  OTHER INSTRUCTIONS:   Knee Replacement:  Do not place pillow  under knee, focus on keeping the knee straight while resting. CPM instructions: 0-90 degrees, 2 hours in the morning, 2 hours in the afternoon, and 2 hours in the evening. Place foam block, curve side up under heel at all times except when in CPM or when walking.  DO NOT modify, tear, cut, or change the foam block in any way.  POST-OPERATIVE OPIOID TAPER INSTRUCTIONS: It is important to wean off of your opioid medication as soon as possible. If you do not need pain medication after your surgery it is ok to stop day one. Opioids include: Codeine, Hydrocodone(Norco, Vicodin), Oxycodone(Percocet, oxycontin) and hydromorphone amongst others.  Long term and even short term use of opiods can cause: Increased pain response Dependence Constipation Depression Respiratory depression And more.  Withdrawal symptoms can include Flu like symptoms Nausea, vomiting And more Techniques to manage these symptoms Hydrate well Eat regular healthy meals Stay active Use relaxation techniques(deep breathing, meditating, yoga) Do Not substitute Alcohol to help with tapering If you have been on opioids for less than two weeks and do not have pain than it is ok to stop all together.  Plan to wean off of opioids This plan should start within one week post op of your joint replacement. Maintain the same interval or time between taking each dose and first decrease the dose.  Cut the total daily intake of opioids by one tablet each day Next start to increase the time between doses. The last dose that should be eliminated is the evening dose.   MAKE SURE YOU:  Understand these instructions.  Get help right away if you are not doing well or get worse.    Thank you for letting us be a part of your medical care team.  It is a privilege we respect greatly.  We hope these instructions will help you stay on track for a fast and full recovery!     Information on my medicine - XARELTO (Rivaroxaban)  This  medication education was reviewed with me or my healthcare representative as part of my discharge preparation.      Why was Xarelto prescribed for you? Xarelto was prescribed for you to reduce the risk of blood clots forming after orthopedic surgery. The medical term for these abnormal blood clots is venous thromboembolism (VTE).  What do you need to know about xarelto ? Take your Xarelto ONCE DAILY at the same time every day. You may take it either with or without food.  If you have difficulty swallowing the tablet whole, you may crush it and mix in applesauce just prior to taking your dose.  Take Xarelto exactly as prescribed by your doctor and DO NOT stop taking Xarelto without talking to the doctor who prescribed the medication.  Stopping without other VTE prevention medication to take the place of Xarelto may increase your risk of developing a clot.  After discharge, you should have regular check-up appointments with your healthcare provider that is prescribing your Xarelto.    What do you do if you miss a dose? If you miss a dose, take it as soon as you remember on the same day then continue your regularly scheduled once daily regimen the next day. Do not take two doses of Xarelto on the same day.   Important Safety Information A possible side effect of Xarelto is bleeding. You should call your healthcare provider right away if you experience any of the following: Bleeding from an injury or your nose that does not stop. Unusual colored urine (red or dark brown) or unusual colored stools (red or black). Unusual bruising for unknown reasons. A serious fall or if you hit your head (even if there is no bleeding).  Some medicines may interact with Xarelto and might increase your risk of bleeding while on Xarelto. To help avoid this, consult your healthcare provider or pharmacist prior to using any new prescription or non-prescription medications, including herbals, vitamins,  non-steroidal anti-inflammatory drugs (NSAIDs) and supplements.  This website has more information on Xarelto: [email or web address].com.   

## 2021-06-25 NOTE — H&P (Signed)
TOTAL KNEE ADMISSION H&P  Patient is being admitted for left total knee arthroplasty.  Subjective:  Chief Complaint:left knee pain.  HPI: Craig [name], 70 y.o. male, has a history of pain and functional disability in the left knee due to arthritis and has failed non-surgical conservative treatments for greater than 12 weeks to includecorticosteriod injections, viscosupplementation injections, and activity modification.  Onset of symptoms was gradual, starting 2 years ago with gradually worsening course since that time. The patient noted prior procedures on the knee to include  arthroscopy and menisectomy on the left knee(s).  Patient currently rates pain in the left knee(s) at 7 out of 10 with activity. Patient has worsening of pain with activity and weight bearing, pain that interferes with activities of daily living, and pain with passive range of motion.  Patient has evidence of joint space narrowing by imaging studies. There is no active infection.  Patient Active Problem List   Diagnosis Date Noted   Atypical chest pain 07/21/2017   Encounter for pre-operative cardiovascular clearance 07/21/2017   History of DVT (deep vein thrombosis) 12/18/2014   Edema of right lower extremity 12/18/2014   Obesity (BMI 30-39.9) 06/24/2013   History of pulmonary embolus (PE) 01/23/2013   DVT (deep venous thrombosis) (HCC) 01/23/2013   Essential hypertension    Past Medical History:  Diagnosis Date   Complication of anesthesia    shortness of breath with colonoscopy but not with most recent one   DVT (deep venous thrombosis) (HCC)    Hypertension    Hypogonadism male    OA (osteoarthritis) of hip    s/p R THR   Osteoarthritis of both knees    Peripheral vascular disease (HCC)    Pre-diabetes     Past Surgical History:  Procedure Laterality Date   CARDIAC CATHETERIZATION  2006   no blockage   CARDIAC CATHETERIZATION  10/30/2008   CARPAL TUNNEL RELEASE  2009   Left   KNEE ARTHROSCOPY      NECK SURGERY     TOTAL HIP ARTHROPLASTY Right 07/2017   [facility]   Venous Doppler, lower extremity Right 05/18/2013   Chronic appearing DVT with some recanalization involving the femoral vein, posterior tibial and peroneal veins. There does appear to be partial resolution of the common femoral vein and popliteal pain thrombus with residual findings distally.    Current Facility-Administered Medications  Medication Dose Route Frequency Provider Last Rate Last Admin   phenylephrine (NEOSYNEPHRINE) 20-0.9 MG/250ML-% infusion            Current Outpatient Medications  Medication Sig Dispense Refill Last Dose   aspirin EC 81 MG tablet Take 81 mg by mouth daily.      carboxymethylcellulose (REFRESH PLUS) 0.5 % SOLN Place 1 drop into both eyes daily as needed (dry eyes).      celecoxib (CELEBREX) 200 MG capsule Take 200 mg by mouth daily.      clindamycin (CLEOCIN) 300 MG capsule Take 900 mg by mouth See admin instructions. Take 900 mg by mouth one hour prior to dental procedures      diclofenac Sodium (VOLTAREN) 1 % GEL Apply 2 g topically 2 (two) times daily as needed (knee pain).      furosemide (LASIX) 20 MG tablet Take 20 mg by mouth daily.      gabapentin (NEURONTIN) 100 MG capsule Take 100 mg by mouth at bedtime.      Multiple Vitamin (MULTIVITAMIN WITH MINERALS) TABS Take 1 tablet by  mouth daily.      nebivolol (BYSTOLIC) 5 MG tablet TAKE 1/2 TABLET BY MOUTH EVERY DAY 45 tablet 3    OVER THE COUNTER MEDICATION Take 1 tablet by mouth daily. Vein Supplement      tadalafil (CIALIS) 5 MG tablet Take 5 mg by mouth at bedtime.      Allergies  Allergen Reactions   Penicillins Rash    Has patient had a PCN reaction causing immediate rash, facial/tongue/throat swelling, SOB or lightheadedness with hypotension: NoNo Has patient had a PCN reaction causing severe rash involving mucus membranes or skin necrosis: NoNo Has patient had a PCN reaction that required hospitalization No  No Has patient had a PCN reaction occurring within the last 10 years: NoNo If all of the above answers are "NO", then may proceed with Cephalosporin    Social History   Tobacco Use   Smoking status: Never   Smokeless tobacco: Never  Substance Use Topics   Alcohol use: No    Family History  Problem Relation Age of Onset   Cancer Mother    Diabetes Father    Stroke Maternal Grandmother    Diabetes Paternal Grandmother    Diabetes Sister      Review of Systems  Constitutional:  Negative for chills and fever.  Respiratory:  Negative for cough and shortness of breath.   Cardiovascular:  Negative for chest pain.  Gastrointestinal:  Negative for nausea and vomiting.  Musculoskeletal:  Positive for arthralgias.    Objective:  Physical Exam Well nourished and well developed. General: Alert and oriented x3, cooperative and pleasant, no acute distress. Head: normocephalic, atraumatic, neck supple. Eyes: EOMI.  Musculoskeletal: Bilateral knee exams: Bilateral genu varum Bilateral flexion contractures of about 5 degrees Pain mainly over the medial and anterior aspect of the knees Flexion of 110 degrees with tightness No lower extremity edema or erythema  Calves soft and nontender. Motor function intact in LE. Strength 5/5 LE bilaterally. Neuro: Distal pulses 2+. Sensation to light touch intact in LE.  Vital signs in last 24 hours:    Labs:   Estimated body mass index is 31.24 kg/m as calculated from the following:   Height as of 06/19/21: 5\' 4"  (1.626 m).   Weight as of 06/19/21: 82.6 kg.   Imaging Review Plain radiographs demonstrate severe degenerative joint disease of the left knee(s). The overall alignment ismild varus. The bone quality appears to be adequate for age and reported activity level.      Assessment/Plan:  End stage arthritis, left knee   The patient history, physical examination, clinical judgment of the provider and imaging studies are  consistent with end stage degenerative joint disease of the left knee(s) and total knee arthroplasty is deemed medically necessary. The treatment options including medical management, injection therapy arthroscopy and arthroplasty were discussed at length. The risks and benefits of total knee arthroplasty were presented and reviewed. The risks due to aseptic loosening, infection, stiffness, patella tracking problems, thromboembolic complications and other imponderables were discussed. The patient acknowledged the explanation, agreed to proceed with the plan and consent was signed. Patient is being admitted for inpatient treatment for surgery, pain control, PT, OT, prophylactic antibiotics, VTE prophylaxis, progressive ambulation and ADL's and discharge planning. The patient is planning to be discharged  home.  Therapy Plans: outpatient therapy at [facility] Disposition: Home with wife Planned DVT Prophylaxis: Xarelto 10mg  daily DME needed: walker PCP: Dr. 06/21/21, awaiting clearance TXA: IV Allergies: PCN - rash (adult) Anesthesia Concerns:  none BMI: 40.1 - Will lose weight before surgery Last HgbA1c: pre-diabetic  Other: - Hx of right leg DVT & PE - no known cause - Oxycodone, robaxin, tylenol, celebrex   Patient's anticipated LOS is less than 2 midnights, meeting these requirements: - Younger than 23 - Lives within 1 hour of care - Has a competent adult at home to recover with post-op recover - NO history of  - Chronic pain requiring opiods  - Diabetes  - Coronary Artery Disease  - Heart failure  - Heart attack  - Stroke  - DVT/VTE  - Cardiac arrhythmia  - Respiratory Failure/COPD  - Renal failure  - Anemia  - Advanced Liver disease  Dennie Bible, PA-C Orthopedic Surgery [facility] [place] [phone]

## 2021-06-25 NOTE — Anesthesia Procedure Notes (Signed)
Spinal  Patient location during procedure: OR Start time: 06/25/2021 10:20 AM End time: 06/25/2021 10:26 AM Reason for block: surgical anesthesia Staffing Anesthesiologist: Eilene Ghazi, MD Preanesthetic Checklist Completed: patient identified, IV checked, site marked, risks and benefits discussed, surgical consent, monitors and equipment checked, pre-op evaluation and timeout performed Spinal Block Patient position: sitting Prep: DuraPrep Patient monitoring: heart rate, cardiac monitor, continuous pulse ox and blood pressure Approach: midline Location: L3-4 Injection technique: single-shot Needle Needle type: Sprotte  Needle gauge: 24 G Needle length: 9 cm Assessment Sensory level: T6 Events: CSF return and second provider

## 2021-06-25 NOTE — Transfer of Care (Signed)
Immediate Anesthesia Transfer of Care Note  Patient: Craig [name]  Procedure(s) Performed: TOTAL KNEE ARTHROPLASTY (Left: Knee)  Patient Location: PACU  Anesthesia Type:MAC and Spinal  Level of Consciousness: awake, alert , oriented and patient cooperative  Airway & Oxygen Therapy: Patient Spontanous Breathing and Patient connected to face mask oxygen  Post-op Assessment: Report given to RN and Post -op Vital signs reviewed and stable  Post vital signs: Reviewed and stable  Last Vitals:  Vitals Value Taken Time  BP 105/64 06/25/21 1240  Temp    Pulse 50 06/25/21 1242  Resp 14 06/25/21 1242  SpO2 100 % 06/25/21 1242  Vitals shown include unvalidated device data.  Last Pain:  Vitals:   06/25/21 0940  TempSrc:   PainSc: Asleep      Patients Stated Pain Goal: 4 (48/01/65 5374)  Complications: No notable events documented.

## 2021-06-25 NOTE — Progress Notes (Signed)
Assisted Dr. Rose with left, ultrasound guided, adductor canal block. Side rails up, monitors on throughout procedure. See vital signs in flow sheet. Tolerated Procedure well.  

## 2021-06-25 NOTE — Interval H&P Note (Signed)
History and Physical Interval Note:  06/25/2021 8:52 AM  [name]  has presented today for surgery, with the diagnosis of left knee osteoarthritis.  The various methods of treatment have been discussed with the patient and family. After consideration of risks, benefits and other options for treatment, the patient has consented to  Procedure(s): TOTAL KNEE ARTHROPLASTY (Left) as a surgical intervention.  The patient's history has been reviewed, patient examined, no change in status, stable for surgery.  I have reviewed the patient's chart and labs.  Questions were answered to the patient's satisfaction.     Shelda Pal

## 2021-06-25 NOTE — Anesthesia Preprocedure Evaluation (Signed)
Anesthesia Evaluation  Patient identified by MRN, date of birth, ID band Patient awake    Reviewed: Allergy & Precautions, NPO status , Patient's Chart, lab work & pertinent test results  Airway Mallampati: II  TM Distance: >3 FB Neck ROM: Full    Dental no notable dental hx.    Pulmonary neg pulmonary ROS,    Pulmonary exam normal breath sounds clear to auscultation       Cardiovascular hypertension, Pt. on medications + Peripheral Vascular Disease  Normal cardiovascular exam Rhythm:Regular Rate:Normal     Neuro/Psych negative neurological ROS  negative psych ROS   GI/Hepatic negative GI ROS, Neg liver ROS,   Endo/Other  Morbid obesity  Renal/GU negative Renal ROS  negative genitourinary   Musculoskeletal  (+) Arthritis , Osteoarthritis,    Abdominal   Peds negative pediatric ROS (+)  Hematology negative hematology ROS (+)   Anesthesia Other Findings   Reproductive/Obstetrics negative OB ROS                             Anesthesia Physical Anesthesia Plan  ASA: 2  Anesthesia Plan: Spinal   Post-op Pain Management:  Regional for Post-op pain   Induction: Intravenous  PONV Risk Score and Plan: 2 and Ondansetron, Dexamethasone and Treatment may vary due to age or medical condition  Airway Management Planned: Simple Face Mask  Additional Equipment:   Intra-op Plan:   Post-operative Plan:   Informed Consent: I have reviewed the patients History and Physical, chart, labs and discussed the procedure including the risks, benefits and alternatives for the proposed anesthesia with the patient or authorized representative who has indicated his/her understanding and acceptance.     Dental advisory given  Plan Discussed with: CRNA and Surgeon  Anesthesia Plan Comments:         Anesthesia [place] Evaluation

## 2021-06-25 NOTE — Op Note (Signed)
NAME:  Craig [name]                      MEDICAL RECORD NO.:  [id number]                             FACILITY:  [facility]      PHYSICIAN:  Madlyn Frankel. Charlann Boxer, M.D.  DATE OF BIRTH:  March 19, 1951      DATE OF PROCEDURE:  06/25/2021                                     OPERATIVE REPORT         PREOPERATIVE DIAGNOSIS:  Left knee osteoarthritis.      POSTOPERATIVE DIAGNOSIS:  Left knee osteoarthritis.      FINDINGS:  The patient was noted to have complete loss of cartilage and   bone-on-bone arthritis with associated osteophytes in the medial and patellofemoral compartments of   the knee with a significant synovitis and associated effusion.  The patient had failed months of conservative treatment including medications, injection therapy, activity modification.     PROCEDURE:  Left total knee replacement.      COMPONENTS USED:  DePuy Attune rotating platform posterior stabilized knee   system, a size 5 femur, 6 tibia, size 14 mm PS AOX insert, and 38 anatomic patellar   button.      SURGEON:  Madlyn Frankel. Charlann Boxer, M.D.      ASSISTANT:  Rosalene Billings, PA-C.      ANESTHESIA:  Regional and Spinal.      SPECIMENS:  None.      COMPLICATION:  None.      DRAINS:  None.  EBL: <100 cc      TOURNIQUET TIME:  39 min at 225 mmHg     The patient was stable to the recovery room.      INDICATION FOR PROCEDURE:  Craig [name] is a 70 y.o. male patient of   mine.  The patient had been seen, evaluated, and treated for months conservatively in the   office with medication, activity modification, and injections.  The patient had   radiographic changes of bone-on-bone arthritis with endplate sclerosis and osteophytes noted.  Based on the radiographic changes and failed conservative measures, the patient   decided to proceed with definitive treatment, total knee replacement.  Risks of infection, DVT, component failure, need for revision surgery, neurovascular injury were reviewed in the office setting.  The postop  course was reviewed stressing the efforts to maximize post-operative satisfaction and function.  Consent was obtained for benefit of pain   relief.  Significant genu varum      PROCEDURE IN DETAIL:  The patient was brought to the operative theater.   Once adequate anesthesia, preoperative antibiotics, 2 gm of Ancef,1 gm of Tranexamic Acid, and 10 mg of Decadron administered, the patient was positioned supine with a left thigh tourniquet placed.  The  left lower extremity was prepped and draped in sterile fashion.  A time-   out was performed identifying the patient, planned procedure, and the appropriate extremity.      The left lower extremity was placed in the [facility] leg holder.  The leg was   exsanguinated, tourniquet elevated to 250 mmHg.  A midline incision was   made followed by median parapatellar arthrotomy.  Following initial  exposure, attention was first directed to the patella.  Precut   measurement was noted to be 24 mm.  I resected down to 14 mm and used a   38 anatomic patellar button to restore patellar height as well as cover the cut surface.      The lug holes were drilled and a metal shim was placed to protect the   patella from retractors and saw blade during the procedure.      At this point, attention was now directed to the femur.  The femoral   canal was opened with a drill, irrigated to try to prevent fat emboli.  An   intramedullary rod was passed at 5 degrees valgus, 9 mm of bone was   resected off the distal femur.  Following this resection, the tibia was   subluxated anteriorly.  Using the extramedullary guide, 2 mm of bone was resected off   the proximal medial tibia.  We confirmed the gap would be   stable medially and laterally with a size 5 spacer block as well as confirmed that the tibial cut was perpendicular in the coronal plane, checking with an alignment rod.      Once this was done, I sized the femur to be a size 5 in the anterior-   posterior  dimension, chose a standard component based on medial and   lateral dimension.  The size 5 rotation block was then pinned in   position anterior referenced using the C-clamp to set rotation.  The   anterior, posterior, and  chamfer cuts were made without difficulty nor   notching making certain that I was along the anterior cortex to help   with flexion gap stability.      The final box cut was made off the lateral aspect of distal femur. Lamina spreader was used to remove posterior osteophytes     At this point, the tibia was sized to be a size 6.  The size 6 tray was   then pinned in position through the medial third of the tubercle,   drilled, and keel punched.  Trial reduction was now carried with a 5 femur,  6 tibia, a size 14 mm PS insert, and the 38 anatomic patella botton.  The knee was brought to full extension with good flexion stability with the patella   tracking through the trochlea without application of pressure.  Given   all these findings the trial components removed.  Final components were   opened and cement was mixed.  The knee was irrigated with normal saline solution and pulse lavage.  The synovial lining was   then injected with 30 cc of 0.25% Marcaine with epinephrine, 1 cc of Toradol and 30 cc of NS for a total of 61 cc.     Final implants were then cemented onto cleaned and dried cut surfaces of bone with the knee brought to extension with a size 14 mm PS trial insert.      Once the cement had fully cured, excess cement was removed   throughout the knee.  I confirmed that I was satisfied with the range of   motion and stability, and the final size 14 mm PS AOX insert was chosen.  It was   placed into the knee.      The tourniquet had been let down at 39 minutes.  No significant   hemostasis was required.  The extensor mechanism was then reapproximated using #1 Vicryl and #1 Stratafix sutures  with the knee   in flexion.  The   remaining wound was closed with 2-0  Vicryl and running 4-0 Monocryl.   The knee was cleaned, dried, dressed sterilely using Dermabond and   Aquacel dressing.  The patient was then   brought to recovery room in stable condition, tolerating the procedure   well.   Please note that Physician Assistant, Rosalene Billings, PA-C was present for the entirety of the case, and was utilized for pre-operative positioning, peri-operative retractor management, general facilitation of the procedure and for primary wound closure at the end of the case.              Madlyn Frankel Charlann Boxer, M.D.    06/25/2021 10:24 AM

## 2021-06-26 DIAGNOSIS — M170 Bilateral primary osteoarthritis of knee: Secondary | ICD-10-CM | POA: Diagnosis not present

## 2021-06-26 LAB — BASIC METABOLIC PANEL
Anion gap: 8 (ref 5–15)
BUN: 15 mg/dL (ref 8–23)
CO2: 23 mmol/L (ref 22–32)
Calcium: 9 mg/dL (ref 8.9–10.3)
Chloride: 109 mmol/L (ref 98–111)
Creatinine, Ser: 1.07 mg/dL (ref 0.61–1.24)
GFR, Estimated: >60 mL/min (ref >60)
Glucose, Bld: 161 mg/dL — ABNORMAL HIGH (ref 70–99)
Potassium: 4.4 mmol/L (ref 3.5–5.1)
Sodium: 140 mmol/L (ref 135–145)

## 2021-06-26 LAB — CBC
HCT: 39.1 % (ref 39.0–52.0)
Hemoglobin: 12.9 g/dL — ABNORMAL LOW (ref 13.0–17.0)
MCH: 29.3 pg (ref 26.0–34.0)
MCHC: 33 g/dL (ref 30.0–36.0)
MCV: 88.7 fL (ref 80.0–100.0)
Platelets: 207 10*3/uL (ref 150–400)
RBC: 4.41 MIL/uL (ref 4.22–5.81)
RDW: 13.3 % (ref 11.5–15.5)
WBC: 9 10*3/uL (ref 4.0–10.5)
nRBC: 0 % (ref 0.0–0.2)

## 2021-06-26 LAB — GLUCOSE, CAPILLARY: Glucose-Capillary: 131 mg/dL — ABNORMAL HIGH (ref 70–99)

## 2021-06-26 MED ORDER — ACETAMINOPHEN 325 MG PO TABS: 1000.0000 mg | ORAL_TABLET | Freq: Four times a day (QID) | ORAL | Status: DC | PRN

## 2021-06-26 MED ORDER — RIVAROXABAN 10 MG PO TABS: 10.0000 mg | ORAL_TABLET | Freq: Every day | ORAL | 0 refills | Status: DC

## 2021-06-26 MED ORDER — METHOCARBAMOL 500 MG PO TABS: 500.0000 mg | ORAL_TABLET | Freq: Four times a day (QID) | ORAL | 0 refills | Status: DC | PRN

## 2021-06-26 MED ORDER — POLYETHYLENE GLYCOL 3350 17 G PO PACK: 17.0000 g | PACK | Freq: Every day | ORAL | 0 refills | Status: DC | PRN

## 2021-06-26 MED ORDER — OXYCODONE HCL 5 MG PO TABS: 5.0000 mg | ORAL_TABLET | ORAL | 0 refills | Status: DC | PRN

## 2021-06-26 MED ORDER — DOCUSATE SODIUM 100 MG PO CAPS: 100.0000 mg | ORAL_CAPSULE | Freq: Two times a day (BID) | ORAL | 0 refills | Status: DC

## 2021-06-26 NOTE — Plan of Care (Signed)
  Problem: Clinical Measurements: Goal: Ability to maintain clinical measurements within normal limits will improve Outcome: Progressing   Problem: Activity: Goal: Risk for activity intolerance will decrease Outcome: Progressing   Problem: Pain Managment: Goal: General experience of comfort will improve Outcome: Progressing   

## 2021-06-26 NOTE — Plan of Care (Signed)
Discharge instructions given to the patient. °

## 2021-06-26 NOTE — Progress Notes (Signed)
Physical Therapy Treatment Patient Details Name: Craig [name] MRN: [id number] DOB: 1951-06-11 Today's Date: 06/26/2021   History of Present Illness Patient is 70 y.o. 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    PT Comments    Patient progressing well with mobility and demonstrated good carryover for bed mobility, and transfers/gait with RW. Patient ambulated ~110' with min guard and no LOB noted. Initiated HEP for ROM and circulation and limited reps due to pain. Acute PT will progress pt in additional session for family training with stair mobility.      Recommendations for follow up therapy are one component of a multi-disciplinary discharge planning process, led by the attending physician.  Recommendations may be updated based on patient status, additional functional criteria and insurance authorization.  Follow Up Recommendations  Follow surgeon's recommendation for DC plan and follow-up therapies     Equipment Recommendations  None recommended by PT    Recommendations for Other Services       Precautions / Restrictions Precautions Precautions: Fall Restrictions Weight Bearing Restrictions: No Other Position/Activity Restrictions: WBAT     Mobility  Bed Mobility Overal bed mobility: Needs Assistance Bed Mobility: Supine to Sit     Supine to sit: Min guard;HOB elevated     General bed mobility comments: pt taking extra time, no use of bed rail and no cues or assist needed    Transfers Overall transfer level: Needs assistance Equipment used: Rolling walker (2 wheeled) Transfers: Sit to/from Stand Sit to Stand: Min guard         General transfer comment: cues for hand placement to rise from EOB, guarding for safety. cues to extend Lt knee with sitting in recliner to prevent excessive knee flexion.  Ambulation/Gait Ambulation/Gait assistance: Min guard Gait Distance (Feet): 110 Feet Assistive device: Rolling walker (2  wheeled) Gait Pattern/deviations: Step-to pattern;Decreased stride length;Shuffle Gait velocity: decr   General Gait Details: pt demonstrated good carryover for step to pattern and walker position with gait. no LOB or buckling at Lt knee, guarding for safety.   Stairs             Wheelchair Mobility    Modified Rankin (Stroke Patients Only)       Balance Overall balance assessment: Needs assistance Sitting-balance support: Feet supported Sitting balance-Leahy Scale: Good     Standing balance support: During functional activity;Bilateral upper extremity supported Standing balance-Leahy Scale: Fair                              Cognition Arousal/Alertness: Awake/alert Behavior During Therapy: WFL for tasks assessed/performed Overall Cognitive Status: Within Functional Limits for tasks assessed                                        Exercises Total Joint Exercises Ankle Circles/Pumps: AROM;Both;20 reps;Seated Quad Sets: AROM;Left;5 reps;Seated Heel Slides: AROM;Left;5 reps;Seated    General Comments        Pertinent Vitals/Pain Pain Assessment: 0-10 Pain Score: 8  Pain Location: Lt knee Pain Descriptors / Indicators: Aching;Discomfort Pain Intervention(s): Limited activity within patient's tolerance;Monitored during session;Repositioned;Patient requesting pain meds-RN notified    Home Living                      Prior Function  PT Goals (current goals can now be found in the care plan section) Acute Rehab PT Goals Patient Stated Goal: get more active PT Goal Formulation: With patient Time For Goal Achievement: 07/02/21 Potential to Achieve Goals: Good Progress towards PT goals: Progressing toward goals    Frequency    7X/week      PT Plan Current plan remains appropriate    Co-evaluation              AM-PAC PT "6 Clicks" Mobility   Outcome Measure  Help needed turning from your back  to your side while in a flat bed without using bedrails?: A Little Help needed moving from lying on your back to sitting on the side of a flat bed without using bedrails?: A Little Help needed moving to and from a bed to a chair (including a wheelchair)?: A Little Help needed standing up from a chair using your arms (e.g., wheelchair or bedside chair)?: A Little Help needed to walk in hospital room?: A Little Help needed climbing 3-5 steps with a railing? : A Little 6 Click Score: 18    End of Session Equipment Utilized During Treatment: Gait belt Activity Tolerance: Patient tolerated treatment well Patient left: in chair;with call bell/phone within reach;with chair alarm set;with family/visitor present Nurse Communication: Mobility status PT Visit Diagnosis: Muscle weakness (generalized) (M62.81);Difficulty in walking, not elsewhere classified (R26.2)     Time: 2703-5009 PT Time Calculation (min) (ACUTE ONLY): 23 min  Charges:  $Gait Training: 8-22 mins $Therapeutic Exercise: 8-22 mins                     [name], DPT Acute Rehabilitation Services Office [phone] Pager [phone]    [name] 06/26/2021, 6:34 PM

## 2021-06-26 NOTE — Anesthesia Postprocedure Evaluation (Signed)
Anesthesia Post Note  Patient: Craig [name]  Procedure(s) Performed: TOTAL KNEE ARTHROPLASTY (Left: Knee)     Patient location during evaluation: PACU Anesthesia Type: Spinal Level of consciousness: oriented and awake and alert Pain management: pain level controlled Vital Signs Assessment: post-procedure vital signs reviewed and stable Respiratory status: spontaneous breathing, respiratory function stable and patient connected to nasal cannula oxygen Cardiovascular status: blood pressure returned to baseline and stable Postop Assessment: no headache, no backache and no apparent nausea or vomiting Anesthetic complications: no   No notable events documented.  Last Vitals:  Vitals:   06/26/21 0213 06/26/21 0603  BP: 110/64 130/78  Pulse: (!) 47 (!) 56  Resp: 16 16  Temp: 36.6 C 36.6 C  SpO2: 97% 100%    Last Pain:  Vitals:   06/26/21 0603  TempSrc: Oral  PainSc:                  [name] S

## 2021-06-26 NOTE — Progress Notes (Signed)
   Subjective: 1 Day Post-Op Procedure(s) (LRB): TOTAL KNEE ARTHROPLASTY (Left) Patient reports pain as mild.   Patient seen in rounds with Dr. Charlann Boxer. Patient is well, and has had no acute complaints or problems. No acute events overnight. Foley catheter removed. Patient ambulated 50 feet with PT.  We will continue therapy today.   Objective: Vital signs in last 24 hours: Temp:  [97.6 F (36.4 C)-99.2 F (37.3 C)] 97.8 F (36.6 C) (09/30 0603) Pulse Rate:  [45-64] 56 (09/30 0603) Resp:  [14-23] 16 (09/30 0603) BP: (103-152)/(64-92) 130/78 (09/30 0603) SpO2:  [91 %-100 %] 100 % (09/30 0603) Weight:  [82.6 kg-104.3 kg] 104.3 kg (09/29 0752)  Intake/Output from previous day:  Intake/Output Summary (Last 24 hours) at 06/26/2021 0743 Last data filed at 06/26/2021 0700 Gross per 24 hour  Intake 3021.15 ml  Output 1600 ml  Net 1421.15 ml     Intake/Output this shift: No intake/output data recorded.  Labs: Recent Labs    06/26/21 0327  HGB 12.9*   Recent Labs    06/26/21 0327  WBC 9.0  RBC 4.41  HCT 39.1  PLT 207   Recent Labs    06/26/21 0327  NA 140  K 4.4  CL 109  CO2 23  BUN 15  CREATININE 1.07  GLUCOSE 161*  CALCIUM 9.0   No results for input(s): LABPT, INR in the last 72 hours.  Exam: General - Patient is Alert and Oriented Extremity - Neurologically intact Sensation intact distally Intact pulses distally Dorsiflexion/Plantar flexion intact Dressing - dressing C/D/I Motor Function - intact, moving foot and toes well on exam.   Past Medical History:  Diagnosis Date   Complication of anesthesia    shortness of breath with colonoscopy but not with most recent one   DVT (deep venous thrombosis) (HCC)    Hypertension    Hypogonadism male    OA (osteoarthritis) of hip    s/p R THR   Osteoarthritis of both knees    Peripheral vascular disease (HCC)    Pre-diabetes     Assessment/Plan: 1 Day Post-Op Procedure(s) (LRB): TOTAL KNEE ARTHROPLASTY  (Left) Active Problems:   S/P total knee arthroplasty, left  Estimated body mass index is 39.48 kg/m as calculated from the following:   Height as of this encounter: 5\' 4"  (1.626 m).   Weight as of this encounter: 104.3 kg. Advance diet Up with therapy D/C IV fluids   Patient's anticipated LOS is less than 2 midnights, meeting these requirements: - Younger than 69 - Lives within 1 hour of care - Has a competent adult at home to recover with post-op recover - NO history of  - Chronic pain requiring opiods  - Diabetes  - Coronary Artery Disease  - Heart failure  - Heart attack  - Stroke  - DVT/VTE  - Cardiac arrhythmia  - Respiratory Failure/COPD  - Renal failure  - Anemia  - Advanced Liver disease     DVT Prophylaxis - Xarelto Weight bearing as tolerated.  Hgb 12.9 this AM.  Plan is to go Home after hospital stay. Plan for discharge today following 1-2 sessions of PT as long as they are meeting their goals. Patient is scheduled for OPPT. Follow up in the office in 2 weeks.   74, PA-C Orthopedic Surgery [phone] 06/26/2021, 7:43 AM

## 2021-06-26 NOTE — Progress Notes (Signed)
Physical Therapy Treatment Patient Details Name: Craig [name] MRN: [id number] DOB: April 10, 1951 Today's Date: 06/26/2021   History of Present Illness Patient is 70 y.o. 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    PT Comments    Patient making good progress and pain improved this session. Patient ambulated ~60' with guarding provided by family with supervision from therapist. Patient completed stair mobility this session with no overt LOB noted and safe guarding from family. Reviewed remaining supine exercises for HEP and addressed questions for car transfer. Pt is at safe level for discharge home with assist from family. Acute PT will continue to progress in this setting.     Recommendations for follow up therapy are one component of a multi-disciplinary discharge planning process, led by the attending physician.  Recommendations may be updated based on patient status, additional functional criteria and insurance authorization.  Follow Up Recommendations  Follow surgeon's recommendation for DC plan and follow-up therapies     Equipment Recommendations  None recommended by PT    Recommendations for Other Services       Precautions / Restrictions Precautions Precautions: Fall Restrictions Weight Bearing Restrictions: No Other Position/Activity Restrictions: WBAT     Mobility  Bed Mobility               General bed mobility comments: pt OOB in recliner    Transfers Overall transfer level: Needs assistance Equipment used: Rolling walker (2 wheeled) Transfers: Sit to/from Stand Sit to Stand: Min guard;Supervision         General transfer comment: guarding/supervision for rise/sit to receliner. good carryover for hand placement with RW and to extend Lt knee with sitting.  Ambulation/Gait Ambulation/Gait assistance: Min guard;Supervision Gait Distance (Feet): 60 Feet Assistive device: Rolling walker (2 wheeled) Gait  Pattern/deviations: Step-to pattern;Decreased stride length;Shuffle Gait velocity: decr   General Gait Details: pt with good/safe step pattern and walker management. pt's daughter and wife present and provided sfae guarding with cues/supervision from therapist.   Stairs Stairs: Yes Stairs assistance: Min guard;Supervision Stair Management: No rails;Backwards;With walker;Step to pattern Number of Stairs: 6 (2x3) General stair comments: cues for sequencing "up with good, down with bad'. pt's wife/family provided assist to stabilize RW and steady gait. no overt LOB noted throughout. pt completed 2x with no cues needed for pt or family on second bout.   Wheelchair Mobility    Modified Rankin (Stroke Patients Only)       Balance Overall balance assessment: Needs assistance Sitting-balance support: Feet supported Sitting balance-Leahy Scale: Good     Standing balance support: During functional activity;Bilateral upper extremity supported Standing balance-Leahy Scale: Fair                              Cognition Arousal/Alertness: Awake/alert Behavior During Therapy: WFL for tasks assessed/performed Overall Cognitive Status: Within Functional Limits for tasks assessed                                        Exercises Total Joint Exercises Ankle Circles/Pumps: AROM;Both;20 reps;Seated Short Arc Quad: AROM;Left;5 reps;Seated Hip ABduction/ADduction: AROM;Left;5 reps;Seated    General Comments        Pertinent Vitals/Pain Pain Assessment: 0-10 Pain Score: 5  Pain Location: Lt knee Pain Descriptors / Indicators: Aching;Discomfort Pain Intervention(s): Limited activity within patient's  tolerance;Monitored during session;Repositioned    Home Living                      Prior Function            PT Goals (current goals can now be found in the care plan section) Acute Rehab PT Goals Patient Stated Goal: get more active PT Goal  Formulation: With patient Time For Goal Achievement: 07/02/21 Potential to Achieve Goals: Good Progress towards PT goals: Progressing toward goals    Frequency    7X/week      PT Plan Current plan remains appropriate    Co-evaluation              AM-PAC PT "6 Clicks" Mobility   Outcome Measure  Help needed turning from your back to your side while in a flat bed without using bedrails?: A Little Help needed moving from lying on your back to sitting on the side of a flat bed without using bedrails?: A Little Help needed moving to and from a bed to a chair (including a wheelchair)?: A Little Help needed standing up from a chair using your arms (e.g., wheelchair or bedside chair)?: A Little Help needed to walk in hospital room?: A Little Help needed climbing 3-5 steps with a railing? : A Little 6 Click Score: 18    End of Session Equipment Utilized During Treatment: Gait belt Activity Tolerance: Patient tolerated treatment well Patient left: in chair;with call bell/phone within reach;with chair alarm set;with family/visitor present Nurse Communication: Mobility status PT Visit Diagnosis: Muscle weakness (generalized) (M62.81);Difficulty in walking, not elsewhere classified (R26.2)     Time: 1341-1405 PT Time Calculation (min) (ACUTE ONLY): 24 min  Charges:  $Gait Training: 8-22 mins $Therapeutic Exercise: 8-22 mins                     [name], DPT Acute Rehabilitation Services Office [phone] Pager [phone]    [name] 06/26/2021, 6:45 PM

## 2021-06-26 NOTE — TOC Transition Note (Signed)
Transition of Care [facility]) - CM/SW Discharge Note   Patient Details  Name: MANUELITO POAGE MRN: [id number] Date of Birth: 05-Jul-1951  Transition of [facility]) CM/SW Contact:  Lennart Pall, LCSW Phone Number: 06/26/2021, 1:21 PM   Clinical Narrative:    Met with pt and confirming he has all needed DME at home.  Plan for OPPT at [facility].  No further TOC needs.   Final next level of care: OP Rehab Barriers to Discharge: No Barriers Identified   Patient Goals and CMS Choice Patient states their goals for this hospitalization and ongoing recovery are:: return home      Discharge Placement                       Discharge Plan and Services                DME Arranged: N/A DME Agency: NA                  Social Determinants of Health (SDOH) Interventions     Readmission Risk Interventions No flowsheet data found.

## 2021-06-28 ENCOUNTER — Encounter (HOSPITAL_COMMUNITY): Payer: Self-pay | Admitting: Orthopedic Surgery

## 2021-06-28 NOTE — Discharge Summary (Signed)
Physician Discharge Summary   Patient ID: Craig [name] MRN: [id number] DOB/AGE: [id number] y.o.  Admit date: 06/25/2021 Discharge date: 06/26/2021  Primary Diagnosis: left knee osteoarthritis  Admission Diagnoses:  Past Medical History:  Diagnosis Date   Complication of anesthesia    shortness of breath with colonoscopy but not with most recent one   DVT (deep venous thrombosis) (HCC)    Hypertension    Hypogonadism male    OA (osteoarthritis) of hip    s/p R THR   Osteoarthritis of both knees    Peripheral vascular disease (HCC)    Pre-diabetes    Discharge Diagnoses:   Active Problems:   S/P total knee arthroplasty, left  Estimated body mass index is 39.48 kg/m as calculated from the following:   Height as of this encounter: 5\' 4"  (1.626 m).   Weight as of this encounter: 104.3 kg.  Procedure:  Procedure(s) (LRB): TOTAL KNEE ARTHROPLASTY (Left)   Consults: None  HPI: Craig [name] is a 70 y.o. male patient of   mine.  The patient had been seen, evaluated, and treated for months conservatively in the   office with medication, activity modification, and injections.  The patient had   radiographic changes of bone-on-bone arthritis with endplate sclerosis and osteophytes noted.  Based on the radiographic changes and failed conservative measures, the patient   decided to proceed with definitive treatment, total knee replacement.  Risks of infection, DVT, component failure, need for revision surgery, neurovascular injury were reviewed in the office setting.  The postop course was reviewed stressing the efforts to maximize post-operative satisfaction and function.  Consent was obtained for benefit of pain   relief.  Significant genu varum  Laboratory Data: Admission on 06/25/2021, Discharged on 06/26/2021  Component Date Value Ref Range Status   ABO/RH(D) 06/25/2021    Final                   Value:B POS Performed at [facility], [address],  [place], [place] [address]    Glucose-Capillary 06/25/2021 109 (A) 70 - 99 mg/dL Final   Glucose reference range applies only to samples taken after fasting for at least 8 hours.   WBC 06/26/2021 9.0  4.0 - 10.5 K/uL Final   RBC 06/26/2021 4.41  4.22 - 5.81 MIL/uL Final   Hemoglobin 06/26/2021 12.9 (A) 13.0 - 17.0 g/dL Final   HCT 32/67/1245 39.1  39.0 - 52.0 % Final   MCV 06/26/2021 88.7  80.0 - 100.0 fL Final   MCH 06/26/2021 29.3  26.0 - 34.0 pg Final   MCHC 06/26/2021 33.0  30.0 - 36.0 g/dL Final   RDW 80/99/8338 13.3  11.5 - 15.5 % Final   Platelets 06/26/2021 207  150 - 400 K/uL Final   nRBC 06/26/2021 0.0  0.0 - 0.2 % Final   Performed at [facility], [address], [place], [place] [address]   Sodium 06/26/2021 140  135 - 145 mmol/L Final   Potassium 06/26/2021 4.4  3.5 - 5.1 mmol/L Final   Chloride 06/26/2021 109  98 - 111 mmol/L Final   CO2 06/26/2021 23  22 - 32 mmol/L Final   Glucose, Bld 06/26/2021 161 (A) 70 - 99 mg/dL Final   Glucose reference range applies only to samples taken after fasting for at least 8 hours.   BUN 06/26/2021 15  8 - 23 mg/dL Final   Creatinine, Ser 06/26/2021 1.07  0.61 - 1.24 mg/dL  Final   Calcium 06/26/2021 9.0  8.9 - 10.3 mg/dL Final   GFR, Estimated 06/26/2021 >60  >60 mL/min Final   Comment: (NOTE) Calculated using the CKD-EPI Creatinine Equation (2021)    Anion gap 06/26/2021 8  5 - 15 Final   Performed at [facility], [address] [place], [place] [address]   Glucose-Capillary 06/25/2021 131 (A) 70 - 99 mg/dL Final   Glucose reference range applies only to samples taken after fasting for at least 8 hours.   Comment 1 06/25/2021 Notify RN   Final  Hospital Outpatient Visit on 06/19/2021  Component Date Value Ref Range Status   MRSA, PCR 06/19/2021 NEGATIVE  NEGATIVE Final   Staphylococcus aureus 06/19/2021 NEGATIVE  NEGATIVE Final   Comment: (NOTE) The Xpert SA Assay (FDA approved for NASAL  specimens in patients 44 years of age and older), is one component of a comprehensive surveillance program. It is not intended to diagnose infection nor to guide or monitor treatment. Performed at [facility], [address] [place], [place] [address]    WBC 06/19/2021 4.8  4.0 - 10.5 K/uL Final   RBC 06/19/2021 4.56  4.22 - 5.81 MIL/uL Final   Hemoglobin 06/19/2021 13.6  13.0 - 17.0 g/dL Final   HCT 54/00/8676 41.3  39.0 - 52.0 % Final   MCV 06/19/2021 90.6  80.0 - 100.0 fL Final   MCH 06/19/2021 29.8  26.0 - 34.0 pg Final   MCHC 06/19/2021 32.9  30.0 - 36.0 g/dL Final   RDW 19/50/9326 13.5  11.5 - 15.5 % Final   Platelets 06/19/2021 216  150 - 400 K/uL Final   nRBC 06/19/2021 0.0  0.0 - 0.2 % Final   Performed at [facility], [address], [place], [place] [address]   Sodium 06/19/2021 139  135 - 145 mmol/L Final   Potassium 06/19/2021 4.1  3.5 - 5.1 mmol/L Final   Chloride 06/19/2021 108  98 - 111 mmol/L Final   CO2 06/19/2021 27  22 - 32 mmol/L Final   Glucose, Bld 06/19/2021 87  70 - 99 mg/dL Final   Glucose reference range applies only to samples taken after fasting for at least 8 hours.   BUN 06/19/2021 14  8 - 23 mg/dL Final   Creatinine, Ser 06/19/2021 1.09  0.61 - 1.24 mg/dL Final   Calcium 80/99/8338 9.4  8.9 - 10.3 mg/dL Final   Total Protein 25/01/3975 6.5  6.5 - 8.1 g/dL Final   Albumin 73/41/9379 3.6  3.5 - 5.0 g/dL Final   AST 02/40/9735 17  15 - 41 U/L Final   ALT 06/19/2021 15  0 - 44 U/L Final   Alkaline Phosphatase 06/19/2021 77  38 - 126 U/L Final   Total Bilirubin 06/19/2021 0.4  0.3 - 1.2 mg/dL Final   GFR, Estimated 06/19/2021 >60  >60 mL/min Final   Comment: (NOTE) Calculated using the CKD-EPI Creatinine Equation (2021)    Anion gap 06/19/2021 4 (A) 5 - 15 Final   Performed at [facility], [address] [place], [place] [address]   ABO/RH(D) 06/19/2021 B POS   Final   Antibody Screen  06/19/2021 NEG   Final   Sample Expiration 06/19/2021 06/28/2021,2359   Final   Extend sample reason 06/19/2021    Final                   Value:NO TRANSFUSIONS OR PREGNANCY IN THE PAST 3 MONTHS  Performed at [facility], [address], [place], [place] [address]    Glucose-Capillary 06/19/2021 103 (A) 70 - 99 mg/dL Final   Glucose reference range applies only to samples taken after fasting for at least 8 hours.   Hgb A1c MFr Bld 06/19/2021 5.8 (A) 4.8 - 5.6 % Final   Comment: (NOTE) Pre diabetes:          5.7%-6.4%  Diabetes:              >6.4%  Glycemic control for   <7.0% adults with diabetes    Mean Plasma Glucose 06/19/2021 119.76  mg/dL Final   Performed at [facility], [address], [place], [place] [address]     X-Rays:No results found.  EKG: Orders placed or performed in visit on 12/23/20   EKG 12-Lead     Hospital Course: Craig [name] is a 70 y.o. who was admitted to [facility]. They were brought to the operating room on 06/25/2021 and underwent Procedure(s): TOTAL KNEE ARTHROPLASTY.  Patient tolerated the procedure well and was later transferred to the recovery room and then to the orthopaedic floor for postoperative care. They were given PO and IV analgesics for pain control following their surgery. They were given 24 hours of postoperative antibiotics of  Anti-infectives (From admission, onward)    Start     Dose/Rate Route Frequency Ordered Stop   06/25/21 1630  ceFAZolin (ANCEF) IVPB 2g/100 mL premix        2 g 200 mL/hr over 30 Minutes Intravenous Every 6 hours 06/25/21 1520 06/25/21 2143   06/25/21 0745  ceFAZolin (ANCEF) IVPB 2g/100 mL premix        2 g 200 mL/hr over 30 Minutes Intravenous On call to O.R. 06/25/21 0732 06/25/21 1057      and started on DVT prophylaxis in the form of Xarelto.   PT and OT were ordered for total joint protocol. Discharge planning consulted to help with postop disposition and equipment  needs.  Patient had a good night on the evening of surgery. They started to get up OOB with therapy on POD #0. Pt was seen during rounds and was ready to go home pending progress with therapy.he worked with therapy on POD #1 and was meeting his goals. Pt was discharged to home later that day in stable condition.  Diet: Regular diet Activity: WBAT Follow-up: in 2 weeks Disposition: Home Discharged Condition: good   Discharge Instructions     Call MD / Call 911   Complete by: As directed    If you experience chest pain or shortness of breath, CALL 911 and be transported to the hospital emergency room.  If you develope a fever above 101 F, pus (white drainage) or increased drainage or redness at the wound, or calf pain, call your surgeon's office.   Change dressing   Complete by: As directed    Maintain surgical dressing until follow up in the clinic. If the edges start to pull up, may reinforce with tape. If the dressing is no longer working, may remove and cover with gauze and tape, but must keep the area dry and clean.  Call with any questions or concerns.   Constipation Prevention   Complete by: As directed    Drink plenty of fluids.  Prune juice may be helpful.  You may use a stool softener, such as Colace (over the counter) 100 mg twice a day.  Use MiraLax (over the counter)  for constipation as needed.   Diet - low sodium heart healthy   Complete by: As directed    Increase activity slowly as tolerated   Complete by: As directed    Weight bearing as tolerated with assist device (walker, cane, etc) as directed, use it as long as suggested by your surgeon or therapist, typically at least 4-6 weeks.   Post-operative opioid taper instructions:   Complete by: As directed    POST-OPERATIVE OPIOID TAPER INSTRUCTIONS: It is important to wean off of your opioid medication as soon as possible. If you do not need pain medication after your surgery it is ok to stop day one. Opioids  include: Codeine, Hydrocodone(Norco, Vicodin), Oxycodone(Percocet, oxycontin) and hydromorphone amongst others.  Long term and even short term use of opiods can cause: Increased pain response Dependence Constipation Depression Respiratory depression And more.  Withdrawal symptoms can include Flu like symptoms Nausea, vomiting And more Techniques to manage these symptoms Hydrate well Eat regular healthy meals Stay active Use relaxation techniques(deep breathing, meditating, yoga) Do Not substitute Alcohol to help with tapering If you have been on opioids for less than two weeks and do not have pain than it is ok to stop all together.  Plan to wean off of opioids This plan should start within one week post op of your joint replacement. Maintain the same interval or time between taking each dose and first decrease the dose.  Cut the total daily intake of opioids by one tablet each day Next start to increase the time between doses. The last dose that should be eliminated is the evening dose.      TED hose   Complete by: As directed    Use stockings (TED hose) for 2 weeks on both leg(s).  You may remove them at night for sleeping.      Allergies as of 06/26/2021       Reactions   Penicillins Rash   Has patient had a PCN reaction causing immediate rash, facial/tongue/throat swelling, SOB or lightheadedness with hypotension: NoNo Has patient had a PCN reaction causing severe rash involving mucus membranes or skin necrosis: NoNo Has patient had a PCN reaction that required hospitalization No No Has patient had a PCN reaction occurring within the last 10 years: NoNo If all of the above answers are "NO", then may proceed with Cephalosporin Tolerated Cephalosporin Date: 06/25/21.        Medication List     STOP taking these medications    aspirin EC 81 MG tablet       TAKE these medications    acetaminophen 325 MG tablet Commonly known as: TYLENOL Take 3 tablets (975  mg total) by mouth every 6 (six) hours as needed for mild pain (pain score 1-3 or temp > 100.5).   carboxymethylcellulose 0.5 % Soln Commonly known as: REFRESH PLUS Place 1 drop into both eyes daily as needed (dry eyes).   celecoxib 200 MG capsule Commonly known as: CELEBREX Take 200 mg by mouth daily.   clindamycin 300 MG capsule Commonly known as: CLEOCIN Take 900 mg by mouth See admin instructions. Take 900 mg by mouth one hour prior to dental procedures   diclofenac Sodium 1 % Gel Commonly known as: VOLTAREN Apply 2 g topically 2 (two) times daily as needed (knee pain).   docusate sodium 100 MG capsule Commonly known as: COLACE Take 1 capsule (100 mg total) by mouth 2 (two) times daily.   furosemide 20 MG tablet Commonly known as:  LASIX Take 20 mg by mouth daily.   gabapentin 100 MG capsule Commonly known as: NEURONTIN Take 100 mg by mouth at bedtime.   methocarbamol 500 MG tablet Commonly known as: ROBAXIN Take 1 tablet (500 mg total) by mouth every 6 (six) hours as needed for muscle spasms.   multivitamin with minerals Tabs tablet Take 1 tablet by mouth daily.   nebivolol 5 MG tablet Commonly known as: BYSTOLIC TAKE 1/2 TABLET BY MOUTH EVERY DAY   OVER THE COUNTER MEDICATION Take 1 tablet by mouth daily. Vein Supplement   oxyCODONE 5 MG immediate release tablet Commonly known as: Oxy IR/ROXICODONE Take 1-2 tablets (5-10 mg total) by mouth every 4 (four) hours as needed for severe pain.   polyethylene glycol 17 g packet Commonly known as: MIRALAX / GLYCOLAX Take 17 g by mouth daily as needed for mild constipation.   rivaroxaban 10 MG Tabs tablet Commonly known as: XARELTO Take 1 tablet (10 mg total) by mouth daily with breakfast for 13 days. Then take aspirin 81 mg TWICE daily for a month   tadalafil 5 MG tablet Commonly known as: CIALIS Take 5 mg by mouth at bedtime.               Discharge Care Instructions  (From admission, onward)            Start     Ordered   06/26/21 0000  Change dressing       Comments: Maintain surgical dressing until follow up in the clinic. If the edges start to pull up, may reinforce with tape. If the dressing is no longer working, may remove and cover with gauze and tape, but must keep the area dry and clean.  Call with any questions or concerns.   06/26/21 0750            Follow-up Information     Durene Romans, MD. Schedule an appointment as soon as possible for a visit in 2 week(s).   Specialty: Orthopedic Surgery Contact information: [address] [place] [address] [phone]                 Signed: Dennie Bible, PA-C Orthopedic Surgery 06/28/2021, 8:15 AM

## 2021-08-07 DIAGNOSIS — G5601 Carpal tunnel syndrome, right upper limb: Secondary | ICD-10-CM | POA: Diagnosis not present

## 2021-10-01 DIAGNOSIS — N401 Enlarged prostate with lower urinary tract symptoms: Secondary | ICD-10-CM | POA: Diagnosis not present

## 2021-10-01 DIAGNOSIS — N5201 Erectile dysfunction due to arterial insufficiency: Secondary | ICD-10-CM | POA: Diagnosis not present

## 2021-10-01 DIAGNOSIS — R351 Nocturia: Secondary | ICD-10-CM | POA: Diagnosis not present

## 2021-10-05 DIAGNOSIS — M25662 Stiffness of left knee, not elsewhere classified: Secondary | ICD-10-CM | POA: Diagnosis not present

## 2021-10-05 DIAGNOSIS — M25562 Pain in left knee: Secondary | ICD-10-CM | POA: Diagnosis not present

## 2021-10-07 DIAGNOSIS — M25662 Stiffness of left knee, not elsewhere classified: Secondary | ICD-10-CM | POA: Diagnosis not present

## 2021-10-07 DIAGNOSIS — M25562 Pain in left knee: Secondary | ICD-10-CM | POA: Diagnosis not present

## 2021-10-12 DIAGNOSIS — M25662 Stiffness of left knee, not elsewhere classified: Secondary | ICD-10-CM | POA: Diagnosis not present

## 2021-10-12 DIAGNOSIS — M25562 Pain in left knee: Secondary | ICD-10-CM | POA: Diagnosis not present

## 2021-10-14 DIAGNOSIS — M25562 Pain in left knee: Secondary | ICD-10-CM | POA: Diagnosis not present

## 2021-10-14 DIAGNOSIS — M25662 Stiffness of left knee, not elsewhere classified: Secondary | ICD-10-CM | POA: Diagnosis not present

## 2021-10-19 DIAGNOSIS — M25662 Stiffness of left knee, not elsewhere classified: Secondary | ICD-10-CM | POA: Diagnosis not present

## 2021-10-19 DIAGNOSIS — M25562 Pain in left knee: Secondary | ICD-10-CM | POA: Diagnosis not present

## 2021-10-21 DIAGNOSIS — M1711 Unilateral primary osteoarthritis, right knee: Secondary | ICD-10-CM | POA: Diagnosis not present

## 2021-10-21 DIAGNOSIS — M25561 Pain in right knee: Secondary | ICD-10-CM | POA: Diagnosis not present

## 2021-10-21 DIAGNOSIS — Z96652 Presence of left artificial knee joint: Secondary | ICD-10-CM | POA: Diagnosis not present

## 2021-10-22 DIAGNOSIS — M25562 Pain in left knee: Secondary | ICD-10-CM | POA: Diagnosis not present

## 2021-10-22 DIAGNOSIS — M25662 Stiffness of left knee, not elsewhere classified: Secondary | ICD-10-CM | POA: Diagnosis not present

## 2021-10-27 DIAGNOSIS — M25562 Pain in left knee: Secondary | ICD-10-CM | POA: Diagnosis not present

## 2021-10-27 DIAGNOSIS — M25662 Stiffness of left knee, not elsewhere classified: Secondary | ICD-10-CM | POA: Diagnosis not present

## 2021-10-29 DIAGNOSIS — M25662 Stiffness of left knee, not elsewhere classified: Secondary | ICD-10-CM | POA: Diagnosis not present

## 2021-10-29 DIAGNOSIS — M25562 Pain in left knee: Secondary | ICD-10-CM | POA: Diagnosis not present

## 2021-11-02 DIAGNOSIS — M25562 Pain in left knee: Secondary | ICD-10-CM | POA: Diagnosis not present

## 2021-11-02 DIAGNOSIS — M25662 Stiffness of left knee, not elsewhere classified: Secondary | ICD-10-CM | POA: Diagnosis not present

## 2021-11-05 DIAGNOSIS — M25662 Stiffness of left knee, not elsewhere classified: Secondary | ICD-10-CM | POA: Diagnosis not present

## 2021-11-05 DIAGNOSIS — M25562 Pain in left knee: Secondary | ICD-10-CM | POA: Diagnosis not present

## 2021-12-14 DIAGNOSIS — H52203 Unspecified astigmatism, bilateral: Secondary | ICD-10-CM | POA: Diagnosis not present

## 2021-12-14 DIAGNOSIS — H2513 Age-related nuclear cataract, bilateral: Secondary | ICD-10-CM | POA: Diagnosis not present

## 2021-12-14 DIAGNOSIS — H524 Presbyopia: Secondary | ICD-10-CM | POA: Diagnosis not present

## 2021-12-14 NOTE — Progress Notes (Signed)
? ?Cardiology Clinic Note  ? ?Patient Name: Craig [name] ?Date of Encounter: 12/21/2021 ? ?Primary Care Provider:  [name], MD ?Primary Cardiologist:  Bryan Lemma, MD ? ?Patient Profile  ?  ?Craig [name] 71 year old male presents to the clinic today for follow-up evaluation of his atypical chest pain and essential hypertension. ? ?Past Medical History  ?  ?Past Medical History:  ?Diagnosis Date  ? Complication of anesthesia   ? shortness of breath with colonoscopy but not with most recent one  ? DVT (deep venous thrombosis) (HCC)   ? Hypertension   ? Hypogonadism male   ? OA (osteoarthritis) of hip   ? s/p R THR  ? Osteoarthritis of both knees   ? Peripheral vascular disease (HCC)   ? Pre-diabetes   ? ?Past Surgical History:  ?Procedure Laterality Date  ? CARDIAC CATHETERIZATION  2006  ? no blockage  ? CARDIAC CATHETERIZATION  10/30/2008  ? CARPAL TUNNEL RELEASE  2009  ? Left  ? KNEE ARTHROSCOPY    ? NECK SURGERY    ? TOTAL HIP ARTHROPLAST[facility] Right 07/2017  ? [facility]  ? TOTAL KNEE ARTHROPLASTY Left 06/25/2021  ? Procedure: TOTAL KNEE ARTHROPLASTY;  Surgeon: Durene Romans, MD;  Location: [facility];  Service: Orthopedics;  Laterality: Left;  ? Venous Doppler, lower extremity Right 05/18/2013  ? Chronic appearing DVT with some recanalization involving the femoral vein, posterior tibial and peroneal veins. There does appear to be partial resolution of the common femoral vein and popliteal pain thrombus with residual findings distally.  ? ? ?Allergies ? ?Allergies  ?Allergen Reactions  ? Penicillins Rash  ?  Has patient had a PCN reaction causing immediate rash, facial/tongue/throat swelling, SOB or lightheadedness with hypotension: NoNo ?Has patient had a PCN reaction causing severe rash involving mucus membranes or skin necrosis: NoNo ?Has patient had a PCN reaction that required hospitalization No No ?Has patient had a PCN reaction occurring within the last 10 years: NoNo ?If all of the above  answers are "NO", then may proceed with Cephalosporin ? ?Tolerated Cephalosporin Date: 06/25/21. ? ?  ? ? ?History of Present Illness  ?  ?Craig [name] has a PMH of HTN, DVT, PE, obesity, atypical chest pain, status post left total knee arthroplasty.  He underwent remote cardiac catheterization 10/30/2008 by Dr. Jacinto Halim.  He was noted to have a abnormal stress test.  His cardiac catheterization showed EF 55%, small 20% stenosis in his proximal RCA segment.  He was also felt to have spasm in his RCA region and was required to have intracoronary nitroglycerin.  His left main, LAD, circumflex were normal.  His echocardiogram on 715 showed normal LVEF, PA pressure 32 mm.  He was treated with Xarelto for 1 year due to lower extremity DVT.  A venous Doppler showed residual right lower extremity chronic DVT.  His venous Doppler 6/17 17 showed no DVT in his bilateral lower extremities.  Post DVT he was noted to have right greater than left lower extremity edema and continued lower extremity compression stocking use.  He uses furosemide as needed.  He was seen by Theodore Demark, PA-C on 10/27/2018.  During that time he was noted to have atypical chest pain.  His heart rate was in the 50s at the time of exam and he was asymptomatic.  A stress test 11/01/2018 showed an EF of 48% and small defect of mild severity at the basal inferior location.  It was felt to be  not reversible and consistent with diaphragmatic attenuation.  It was a low risk study.  He was seen virtually by Domenic Polite, PA-C 12/03/2019.  At that time he was doing well. ? ?He was seen by Azalee Course, PA-C on 12/23/2020.  During that time he denied chest discomfort.  He did note mild worsening dyspnea with exertion.  He felt this was related to his knee issue and deconditioning.  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  He denied increased lower extremity swelling.  He continued to wear his lower extremity compression stockings.  His blood pressure was well controlled at  home.  His heart rate continued to be low in the 40s.  He denied symptoms of lightheadedness, dizziness, blurred vision or feeling that he may pass out.  His nebivolol 2.5 mg was continued.  It was felt that if his heart rate continued to decrease his nebivolol would need to be discontinued.  He was pending cervical neck surgery with Dr. Lovell Sheehan.  He was given permission from a cardiac standpoint to proceed with the procedure.  It was recommended that he hold his aspirin for 7 days prior to the procedure. ? ?He presents to the clinic today for follow-up evaluation and states he feels well.  He continues to drive long-haul.  He reports that his longest trip is to [facility].  He remains physically active doing core calisthenics.  He tolerated his left knee replacement well.  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  He is able to perform these activities without chest discomfort.  We reviewed the importance of heart healthy low-sodium diet and continuing to work on calorie restricted diet.  He is tolerating his medications well without side effects.  His EKG today shows sinus bradycardia incomplete right bundle branch block 53 bpm.  We will plan follow-up for 12 months. ? ?Today he denies chest pain, shortness of breath, lower extremity edema, fatigue, palpitations, melena, hematuria, hemoptysis, diaphoresis, weakness, presyncope, syncope, orthopnea, and PND. ? ? ?Home Medications  ?  ?Prior to Admission medications   ?Medication Sig Start Date End Date Taking? Authorizing Provider  ?acetaminophen (TYLENOL) 325 MG tablet Take 3 tablets (975 mg total) by mouth every 6 (six) hours as needed for mild pain (pain score 1-3 or temp > 100.5). 06/26/21   Cassandria Anger, PA-C  ?carboxymethylcellulose (REFRESH PLUS) 0.5 % SOLN Place 1 drop into both eyes daily as needed (dry eyes).    [provider]  ?celecoxib (CELEBREX) 200 MG  capsule Take 200 mg by mouth daily. 08/12/20   [provider]  ?clindamycin (CLEOCIN) 300 MG capsule Take 900 mg by mouth See admin instructions. Take 900 mg by mouth one hour prior to dental procedures 10/02/20   [provider]  ?diclofenac Sodium (VOLTAREN) 1 % GEL Apply 2 g topically 2 (two) times daily as needed (knee pain).    [provider]  ?docusate sodium (COLACE) 100 MG capsule Take 1 capsule (100 mg total) by mouth 2 (two) times daily. 06/26/21   Cassandria Anger, PA-C  ?furosemide (LASIX) 20 MG tablet Take 20 mg by mouth daily. 06/08/21   [provider]  ?gabapentin (NEURONTIN) 100 MG capsule Take 100 mg by mouth at bedtime. 10/03/20   [provider]  ?methocarbamol (ROBAXIN) 500 MG tablet Take 1 tablet (500 mg total) by mouth every 6 (six) hours as needed for muscle spasms.  06/26/21   Cassandria Angeronovan, Ashley R, PA-C  ?Multiple Vitamin (MULTIVITAMIN WITH MINERALS) TABS Take 1 tablet by mouth daily.    [provider]  ?nebivolol (BYSTOLIC) 5 MG tablet TAKE 1/2 TABLET BY MOUTH EVERY DAY 02/11/21   Marykay LexHarding, David W, MD  ?OVER THE COUNTER MEDICATION Take 1 tablet by mouth daily. Vein Supplement    [provider]  ?oxyCODONE (OXY IR/ROXICODONE) 5 MG immediate release tablet Take 1-2 tablets (5-10 mg total) by mouth every 4 (four) hours as needed for severe pain. 06/26/21   Cassandria Angeronovan, Ashley R, PA-C  ?polyethylene glycol (MIRALAX / GLYCOLAX) 17 g packet Take 17 g by mouth daily as needed for mild constipation. 06/26/21   Cassandria Angeronovan, Ashley R, PA-C  ?rivaroxaban (XARELTO) 10 MG TABS tablet Take 1 tablet (10 mg total) by mouth daily with breakfast for 13 days. Then take aspirin 81 mg TWICE daily for a month 06/26/21 07/09/21  Cassandria Angeronovan, Ashley R, PA-C  ?tadalafil (CIALIS) 5 MG tablet Take 5 mg by mouth at bedtime. 10/02/20   [provider]  ? ? ?Family History  ?  ?Family History  ?Problem Relation Age of Onset  ? Cancer Mother   ? Diabetes Father   ? Stroke  Maternal Grandmother   ? Diabetes Paternal Grandmother   ? Diabetes Sister   ? ?He indicated that his mother is deceased. He indicated that his father is alive. He indicated that the status of his sister is un

## 2021-12-16 DIAGNOSIS — M1711 Unilateral primary osteoarthritis, right knee: Secondary | ICD-10-CM | POA: Diagnosis not present

## 2021-12-16 DIAGNOSIS — Z96652 Presence of left artificial knee joint: Secondary | ICD-10-CM | POA: Diagnosis not present

## 2021-12-16 DIAGNOSIS — M25562 Pain in left knee: Secondary | ICD-10-CM | POA: Diagnosis not present

## 2021-12-21 ENCOUNTER — Other Ambulatory Visit: Payer: Self-pay

## 2021-12-21 ENCOUNTER — Ambulatory Visit (INDEPENDENT_AMBULATORY_CARE_PROVIDER_SITE_OTHER): Payer: BC Managed Care – PPO | Admitting: General Practice

## 2021-12-21 ENCOUNTER — Encounter: Payer: Self-pay | Admitting: General Practice

## 2021-12-21 VITALS — BP 118/70 | HR 53 | Ht 64.0 in | Wt 243.8 lb

## 2021-12-21 DIAGNOSIS — Z6841 Body Mass Index (BMI) 40.0 and over, adult: Secondary | ICD-10-CM | POA: Diagnosis not present

## 2021-12-21 DIAGNOSIS — I2510 Atherosclerotic heart disease of native coronary artery without angina pectoris: Secondary | ICD-10-CM | POA: Diagnosis not present

## 2021-12-21 DIAGNOSIS — I10 Essential (primary) hypertension: Secondary | ICD-10-CM | POA: Diagnosis not present

## 2021-12-21 DIAGNOSIS — Z86718 Personal history of other venous thrombosis and embolism: Secondary | ICD-10-CM

## 2021-12-21 NOTE — Patient Instructions (Signed)
Medication Instructions:  ?The current medical regimen is effective;  continue present plan and medications as directed. Please refer to the Current Medication list given to you today.  ? ?*If you need a refill on your cardiac medications before your next appointment, please call your pharmacy* ? ?Lab Work:   Testing/Procedures:  ?NONE    NONE ? ?Special Instructions ?PLEASE READ AND FOLLOW SALTY 6-ATTACHED-1,800mg  daily ? ?TAKE AND LOG YOUR BLOOD PRESSURE AT LEAST TWICE A MONTH AND IF YOU FEEL BAD. ? ?Follow-Up: ?Your next appointment:  12 month(s) In Person with Bryan Lemma, MD   ? ?Please call our office 2 months in advance to schedule this appointment  :1 ? ?At [facility], you and your health needs are our priority.  As part of our continuing mission to provide you with exceptional heart care, we have created designated Provider Care Teams.  These Care Teams include your primary Cardiologist (physician) and Advanced Practice Providers (APPs -  Physician Assistants and Nurse Practitioners) who all work together to provide you with the care you need, when you need it. ? ? ? ?        6 SALTY THINGS TO AVOID     1,800MG  DAILY ? ? ? ? ?  ?

## 2022-01-03 ENCOUNTER — Other Ambulatory Visit: Payer: Self-pay | Admitting: Cardiology

## 2022-03-09 DIAGNOSIS — I10 Essential (primary) hypertension: Secondary | ICD-10-CM | POA: Diagnosis not present

## 2022-03-09 DIAGNOSIS — Z125 Encounter for screening for malignant neoplasm of prostate: Secondary | ICD-10-CM | POA: Diagnosis not present

## 2022-03-09 DIAGNOSIS — R7303 Prediabetes: Secondary | ICD-10-CM | POA: Diagnosis not present

## 2022-03-09 DIAGNOSIS — Z0000 Encounter for general adult medical examination without abnormal findings: Secondary | ICD-10-CM | POA: Diagnosis not present

## 2022-03-09 DIAGNOSIS — Z6841 Body Mass Index (BMI) 40.0 and over, adult: Secondary | ICD-10-CM | POA: Diagnosis not present

## 2022-07-12 DIAGNOSIS — Z23 Encounter for immunization: Secondary | ICD-10-CM | POA: Diagnosis not present

## 2022-07-12 DIAGNOSIS — H9209 Otalgia, unspecified ear: Secondary | ICD-10-CM | POA: Diagnosis not present

## 2022-07-28 DIAGNOSIS — M1711 Unilateral primary osteoarthritis, right knee: Secondary | ICD-10-CM | POA: Diagnosis not present

## 2022-07-28 DIAGNOSIS — Z96652 Presence of left artificial knee joint: Secondary | ICD-10-CM | POA: Diagnosis not present

## 2022-08-24 DIAGNOSIS — R350 Frequency of micturition: Secondary | ICD-10-CM | POA: Diagnosis not present

## 2022-08-24 DIAGNOSIS — R7303 Prediabetes: Secondary | ICD-10-CM | POA: Diagnosis not present

## 2022-09-13 DIAGNOSIS — R09A9 Foreign body sensation, other site: Secondary | ICD-10-CM | POA: Diagnosis not present

## 2022-10-15 DIAGNOSIS — M25511 Pain in right shoulder: Secondary | ICD-10-CM | POA: Diagnosis not present

## 2022-10-26 ENCOUNTER — Telehealth: Payer: Self-pay

## 2022-10-26 NOTE — Telephone Encounter (Signed)
Primary Cardiologist:David Ellyn Hack, MD  Chart reviewed as part of pre-operative protocol coverage. Because of Craig [name]'s past medical history and time since last visit, he/she will require a follow-up visit in order to better assess preoperative cardiovascular risk.  Pre-op covering staff: - Patient has appointment with Coletta Memos, NP on 11/10/22 at which time clearance will be addressed. - Please contact requesting surgeon's office via preferred method (i.e, phone, fax) to inform them of need for appointment prior to surgery.  Agreement regarding the holding of aspirin will be addressed by provider at the time of the visit.  Emmaline Life, NP-C  10/26/2022, 7:58 AM [address], [address] Office [phone] Fax [phone]

## 2022-10-26 NOTE — Telephone Encounter (Addendum)
   Pre-operative Risk Assessment    Patient Name: Craig [name]  DOB: [birth year] MRN: [id number]      Request for Surgical Clearance    Procedure:   Right total knee arthroplasty  Date of Surgery:  Clearance 11/16/22                                 Surgeon:  Dr. Paralee Cancel  Surgeon's Group or Practice Name:  [facility] Phone number:  [phone] Fax number:  [phone]   Type of Clearance Requested:   - Medical    Type of Anesthesia:  Spinal   Additional requests/questions:  Please instruct on Asprin  SignedJacqulynn Cadet   10/26/2022, 7:36 AM

## 2022-10-29 DIAGNOSIS — I10 Essential (primary) hypertension: Secondary | ICD-10-CM | POA: Diagnosis not present

## 2022-10-29 DIAGNOSIS — R351 Nocturia: Secondary | ICD-10-CM | POA: Diagnosis not present

## 2022-10-29 DIAGNOSIS — N401 Enlarged prostate with lower urinary tract symptoms: Secondary | ICD-10-CM | POA: Diagnosis not present

## 2022-10-29 DIAGNOSIS — N5201 Erectile dysfunction due to arterial insufficiency: Secondary | ICD-10-CM | POA: Diagnosis not present

## 2022-10-29 DIAGNOSIS — M1990 Unspecified osteoarthritis, unspecified site: Secondary | ICD-10-CM | POA: Diagnosis not present

## 2022-11-05 DIAGNOSIS — M1711 Unilateral primary osteoarthritis, right knee: Secondary | ICD-10-CM | POA: Diagnosis not present

## 2022-11-05 NOTE — Progress Notes (Addendum)
Anesthesia Review:  PCP: Roland Polite LOV 10/29/22  Cardiologist : Francia Greaves LOV 12/21/21  Chest x-ray : EKG : 12/21/21  Echo : 2015  Stress test: 2020  Cardiac Cath :  2010  Activity level:  Sleep Study/ CPAP : Fasting Blood Sugar :      / Checks Blood Sugar -- times a day:   Blood Thinner/ Instructions /Last Dose: ASA / Instructions/ Last Dose :    Prediabetes  Hgba1c-

## 2022-11-05 NOTE — Patient Instructions (Signed)
SURGICAL WAITING ROOM VISITATION  Patients having surgery or a procedure may have no more than 2 support people in the waiting area - these visitors may rotate.    Children under the age of 29 must have an adult with them who is not the patient.  Due to an increase in RSV and influenza rates and associated hospitalizations, children ages 43 and under may not visit patients in [place].  If the patient needs to stay at the hospital during part of their recovery, the visitor guidelines for inpatient rooms apply. Pre-op nurse will coordinate an appropriate time for 1 support person to accompany patient in pre-op.  This support person may not rotate.    Please refer to the [facility] website for the visitor guidelines for Inpatients (after your surgery is over and you are in a regular room).       Your procedure is scheduled on:  11/16/2022    Report to [facility] Main Entrance    Report to admitting at  [place] AM   Call this number if you have problems the morning of surgery [phone]   Do not eat food :After Midnight.   After Midnight you may have the following liquids until __ 0415____ AM  DAY OF SURGERY  Water Non-Citrus Juices (without pulp, NO RED-Apple, White grape, White cranberry) Black Coffee (NO MILK/CREAM OR CREAMERS, sugar ok)  Clear Tea (NO MILK/CREAM OR CREAMERS, sugar ok) regular and decaf                             Plain Jell-O (NO RED)                                           Fruit ices (not with fruit pulp, NO RED)                                     Popsicles (NO RED)                                                               Sports drinks like Gatorade (NO RED)                    The day of surgery:  Drink ONE (1) Pre-Surgery Clear Ensure or G2 at   0415AM ( have completed by )  the morning of surgery. Drink in one sitting. Do not sip.  This drink was given to you during your hospital  pre-op appointment visit. Nothing else to  drink after completing the  Pre-Surgery Clear Ensure or G2.          If you have questions, please contact your surgeon's office.     Oral Hygiene is also important to reduce your risk of infection.                                    Remember - BRUSH YOUR TEETH THE MORNING OF SURGERY WITH YOUR  REGULAR TOOTHPASTE  DENTURES WILL BE REMOVED PRIOR TO SURGERY PLEASE DO NOT APPLY "Poly grip" OR ADHESIVES!!!   Do NOT smoke after Midnight   Take these medicines the morning of surgery with A SIP OF WATER:  nebivolol   DO NOT TAKE ANY ORAL DIABETIC MEDICATIONS DAY OF YOUR SURGERY  Bring CPAP mask and tubing day of surgery.                              You may not have any metal on your body including hair pins, jewelry, and body piercing             Do not wear make-up, lotions, powders, perfumes/cologne, or deodorant  Do not wear nail polish including gel and S&S, artificial/acrylic nails, or any other type of covering on natural nails including finger and toenails. If you have artificial nails, gel coating, etc. that needs to be removed by a nail salon please have this removed prior to surgery or surgery may need to be canceled/ delayed if the surgeon/ anesthesia feels like they are unable to be safely monitored.   Do not shave  48 hours prior to surgery.               Men may shave face and neck.   Do not bring valuables to the hospital. [place].   Contacts, glasses, dentures or bridgework may not be worn into surgery.   Bring small overnight bag day of surgery.   DO NOT [place]. PHARMACY WILL DISPENSE MEDICATIONS LISTED ON YOUR MEDICATION LIST TO YOU DURING YOUR ADMISSION [place]!    Patients discharged on the day of surgery will not be allowed to drive home.  Someone NEEDS to stay with you for the first 24 hours after anesthesia.   Special Instructions: Bring a copy of your healthcare  power of attorney and living will documents the day of surgery if you haven't scanned them before.              Please read over the following fact sheets you were given: IF [place] [phone]   If you received a COVID test during your pre-op visit  it is requested that you wear a mask when out in public, stay away from anyone that may not be feeling well and notify your surgeon if you develop symptoms. If you test positive for Covid or have been in contact with anyone that has tested positive in the last 10 days please notify you surgeon.    [place] - Preparing for Surgery Before surgery, you can play an important role.  Because skin is not sterile, your skin needs to be as free of germs as possible.  You can reduce the number of germs on your skin by washing with CHG (chlorahexidine gluconate) soap before surgery.  CHG is an antiseptic cleaner which kills germs and bonds with the skin to continue killing germs even after washing. Please DO NOT use if you have an allergy to CHG or antibacterial soaps.  If your skin becomes reddened/irritated stop using the CHG and inform your nurse when you arrive at Short Stay. Do not shave (including legs and underarms) for at least 48 hours prior to the first CHG shower.  You may shave your face/neck. Please follow these instructions carefully:  1.  Shower with CHG Soap the night before surgery and the  morning of Surgery.  2.  If you choose to wash your hair, wash your hair first as usual with your  normal  shampoo.  3.  After you shampoo, rinse your hair and body thoroughly to remove the  shampoo.                           4.  Use CHG as you would any other liquid soap.  You can apply chg directly  to the skin and wash                       Gently with a scrungie or clean washcloth.  5.  Apply the CHG Soap to your body ONLY FROM THE NECK DOWN.   Do not use on face/ open                           Wound  or open sores. Avoid contact with eyes, ears mouth and genitals (private parts).                       Wash face,  Genitals (private parts) with your normal soap.             6.  Wash thoroughly, paying special attention to the area where your surgery  will be performed.  7.  Thoroughly rinse your body with warm water from the neck down.  8.  DO NOT shower/wash with your normal soap after using and rinsing off  the CHG Soap.                9.  Pat yourself dry with a clean towel.            10.  Wear clean pajamas.            11.  Place clean sheets on your bed the night of your first shower and do not  sleep with pets. Day of Surgery : Do not apply any lotions/deodorants the morning of surgery.  Please wear clean clothes to the hospital/surgery center.  FAILURE TO FOLLOW THESE INSTRUCTIONS MAY RESULT IN THE CANCELLATION OF YOUR SURGERY PATIENT SIGNATURE_________________________________  NURSE SIGNATURE__________________________________  ________________________________________________________________________

## 2022-11-08 ENCOUNTER — Other Ambulatory Visit: Payer: Self-pay

## 2022-11-08 ENCOUNTER — Encounter (HOSPITAL_COMMUNITY): Payer: Self-pay

## 2022-11-08 ENCOUNTER — Encounter (HOSPITAL_COMMUNITY)
Admission: RE | Admit: 2022-11-08 | Discharge: 2022-11-08 | Disposition: A | Discharge status: Home / Self Care | Payer: BC Managed Care – PPO | Source: Ambulatory Visit | Attending: Orthopedic Surgery | Admitting: Orthopedic Surgery

## 2022-11-08 VITALS — BP 139/82 | HR 51 | Temp 98.1°F | Resp 16 | Ht 64.0 in | Wt 231.0 lb

## 2022-11-08 DIAGNOSIS — R7309 Other abnormal glucose: Secondary | ICD-10-CM | POA: Insufficient documentation

## 2022-11-08 DIAGNOSIS — R7303 Prediabetes: Secondary | ICD-10-CM | POA: Diagnosis not present

## 2022-11-08 DIAGNOSIS — Z01812 Encounter for preprocedural laboratory examination: Secondary | ICD-10-CM | POA: Insufficient documentation

## 2022-11-08 DIAGNOSIS — Z01818 Encounter for other preprocedural examination: Secondary | ICD-10-CM

## 2022-11-08 LAB — CBC
HCT: 44 % (ref 39.0–52.0)
Hemoglobin: 14.1 g/dL (ref 13.0–17.0)
MCH: 29.2 pg (ref 26.0–34.0)
MCHC: 32 g/dL (ref 30.0–36.0)
MCV: 91.1 fL (ref 80.0–100.0)
Platelets: 178 10*3/uL (ref 150–400)
RBC: 4.83 MIL/uL (ref 4.22–5.81)
RDW: 13.3 % (ref 11.5–15.5)
WBC: 4.4 10*3/uL (ref 4.0–10.5)
nRBC: 0 % (ref 0.0–0.2)

## 2022-11-08 LAB — BASIC METABOLIC PANEL
Anion gap: 7 (ref 5–15)
BUN: 12 mg/dL (ref 8–23)
CO2: 28 mmol/L (ref 22–32)
Calcium: 9.1 mg/dL (ref 8.9–10.3)
Chloride: 104 mmol/L (ref 98–111)
Creatinine, Ser: 1.09 mg/dL (ref 0.61–1.24)
GFR, Estimated: >60 mL/min (ref >60)
Glucose, Bld: 113 mg/dL — ABNORMAL HIGH (ref 70–99)
Potassium: 4.5 mmol/L (ref 3.5–5.1)
Sodium: 139 mmol/L (ref 135–145)

## 2022-11-08 LAB — GLUCOSE, CAPILLARY: Glucose-Capillary: 109 mg/dL — ABNORMAL HIGH (ref 70–99)

## 2022-11-08 LAB — SURGICAL PCR SCREEN
MRSA, PCR: NEGATIVE
Staphylococcus aureus: NEGATIVE

## 2022-11-08 LAB — HEMOGLOBIN A1C
Hgb A1c MFr Bld: 5.8 % — ABNORMAL HIGH (ref 4.8–5.6)
Mean Plasma Glucose: 119.76 mg/dL

## 2022-11-08 NOTE — Progress Notes (Unsigned)
Cardiology Office Note:    Date:  11/10/2022   ID:  Craig [name], DOB 01-12-1951, MRN [id number]  PCP:  Seward Carol, [place] Providers Cardiologist:  Glenetta Hew, MD {  Referring MD: Seward Carol, MD   Chief Complaint  Patient presents with   Follow-up    HTN, mild CAD     Pre-op Exam    History of Present Illness:    Craig [name] is a 72 y.o. male with a hx of peripheral vascular disease, HTN, pre-diabetes, history of DVT who is followed by Dr. Ellyn Hack. Patient presents today for preoperative evaluation prior to total knee replacement scheduled for 11/16/22.   Per chart review, in 2010, patient had an abnormal treadmill stress test and he underwent cardiac catheterization on 10/30/08 with Dr. Einar Gip. Cardiac catheterization showed EF 55%, small 20% stenosis in the proximal RCA segment. He was noted to have spasm in his RCA, and required intracoronary nitroglycerin. His left main, LAD, and circumflex were normal. In 2014, patient was found to have an acute right DVT and he was treated with Xarelto until 04/2014. Most recent doppler studies in 6/17 showed no evidence of DVT in bilateral lower extremities. Post DVT, patient continued to have right>left lower extremity edema, and continued to [place] lower extremity compression stockings and lasix as needed. He was seen in office on 10/27/18, during which time he complained of atypical chest pain. He underwent nuclear stress test on 11/01/18 that showed EF 48%, a small defect of mild severity present in the basal inferior location, and a small defect of mild severity present in the apex location. Study was reviewed by Dr. Ellyn Hack and defects were felt to be diaphragmatic attenuation, and the study was thought to be low risk.   Patient was seen in office on 12/23/20 and denied having chest discomfort at that time. He did complain of mild worsening dyspnea on exertion that he thought was related to knee pain and deconditioning. He  denied increased lower extremity edema and had continued to wear is compression stockings. HR was in the 40s, but patient denied symptoms of lightheadedness, dizziness, blurred visoin, or feeling that he may pass out. He had a pending cervical neck surgery with Dr. Arnoldo Morale, and he was given permission from a cardiac standpoint to proceed with the procedure. He was last seen by cardiology on 12/21/21 and reported doing well at that time. He was able to walk about 1/4 mile daily without chest discomfort.   Today patient reports that he has been doing very well since his last visit.  Patient drives a tractor trailer, and is able to lift heavy items, climb into his truck without developing chest pain or shortness of breath.  Each morning, he has about a 10-minute walk into his office and he does not have chest pain or shortness of breath while walking.  He denies palpitations, dizziness, lightheadedness, syncope, near syncope.  He does have a history of right lower extremity DVT, occasionally will have swelling in his right lower extremity.  His primary care provider started him on Lasix to help manage this, and patient has noticed improvement.  Today, he does not have lower extremity edema.  Reports that his heart rate is often in the 50s, does not have issues with dizziness, syncope, near syncope, weakness.  He has knee surgery scheduled for 11/16/2022  Past Medical History:  Diagnosis Date   Complication of anesthesia    shortness of breath with colonoscopy but  not with most recent one   DVT (deep venous thrombosis) (HCC)    Hypertension    Hypogonadism male    OA (osteoarthritis) of hip    s/p R THR   Osteoarthritis of both knees    Peripheral vascular disease ([place])    Pre-diabetes     Past Surgical History:  Procedure Laterality Date   CARDIAC CATHETERIZATION  2006   no blockage   CARDIAC CATHETERIZATION  10/30/2008   CARPAL TUNNEL RELEASE  2009   Left   HEMORRHOIDECTOMY WITH HEMORRHOID  BANDING     KNEE ARTHROSCOPY     NECK SURGERY     TOTAL HIP ARTHROPLASTY Right 07/2017   Dr Alvan Dame, at [place] Left 06/25/2021   Procedure: TOTAL KNEE ARTHROPLASTY;  Surgeon: Paralee Cancel, MD;  Location: [facility];  Service: Orthopedics;  Laterality: Left;   Venous Doppler, lower extremity Right 05/18/2013   Chronic appearing DVT with some recanalization involving the femoral vein, posterior tibial and peroneal veins. There does appear to be partial resolution of the common femoral vein and popliteal pain thrombus with residual findings distally.    Current Medications: Current Meds  Medication Sig   aspirin EC 81 MG tablet Take 81 mg by mouth daily. Swallow whole.   carboxymethylcellulose (REFRESH PLUS) 0.5 % SOLN Place 1 drop into both eyes daily as needed (dry eyes).   celecoxib (CELEBREX) 200 MG capsule Take 200 mg by mouth daily.   clindamycin (CLEOCIN) 300 MG capsule Take 900 mg by mouth See admin instructions. Take 900 mg by mouth one hour prior to dental procedures   diclofenac Sodium (VOLTAREN) 1 % GEL Apply 2 g topically 2 (two) times daily as needed (knee pain).   Fluocinolone Acetonide 0.01 % OIL Place 5 drops into both ears daily as needed (dry ears).   furosemide (LASIX) 20 MG tablet Take 20 mg by mouth daily.   gabapentin (NEURONTIN) 100 MG capsule Take 100 mg by mouth at bedtime.   Multiple Vitamin (MULTIVITAMIN WITH MINERALS) TABS Take 5 tablets by mouth daily.   nebivolol (BYSTOLIC) 5 MG tablet TAKE 1/2 TABLET BY MOUTH EVERY DAY   OVER THE COUNTER MEDICATION Take 1 tablet by mouth daily. Vein Supplement   sildenafil (REVATIO) 20 MG tablet Take 20 mg by mouth daily as needed (ED).   tadalafil (CIALIS) 5 MG tablet Take 5 mg by mouth at bedtime.   tamsulosin (FLOMAX) 0.4 MG CAPS capsule Take 0.4 mg by mouth daily.     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 For that reason has not been out exercising much. He was really to work on his weight loss. Unfortunately of the PE his knees really slowed her progress. He does not  drink alcohol and does not smoke.   Social Determinants of Health   Financial Resource Strain: Not on file  Food Insecurity: Not on file  Transportation Needs: Not on file  Physical Activity: Not on file  Stress: Not on file  Social Connections: Not on file     Family History: The patient's family history includes Cancer in his mother; Diabetes in his father, paternal grandmother, and sister; Stroke in his maternal grandmother.  ROS:   Please see the history of present illness.     All other systems reviewed and are negative.  EKGs/Labs/Other Studies Reviewed:    The following studies were reviewed today:  Nuclear stress test 11/01/2018 The left ventricular ejection fraction is mildly decreased (45-54%). Nuclear stress EF: 48%. There was no ST segment deviation noted during stress. There is a small defect of mild severity present in the basal inferior location. The defect is non-reversible and consistent with diaphragmatic attenuation artifact. There is a small defect of mild severity present in the apex location. The defect is reversible and could represent a small area of ischemia vs. Variations in diaphragmatic attenuation artifact. This is a low risk study  Echocardiogram 10/03/13 - Left ventricle: The  cavity size was normal. There was mild    concentric hypertrophy. Systolic function was normal. Wall    motion was normal; there were no regional wall motion    abnormalities. Left ventricular diastolic function    parameters were normal.  - Mitral valve: Valve area by pressure half-time: 1.82cm^2.  - Pulmonary arteries: PA peak pressure: 67m Hg (S).  Transthoracic echocardiography.  M-mode, complete 2D,  spectral Doppler, and color Doppler.   EKG:  EKG is ordered today.  The ekg ordered today demonstrates sinus bradycardia with HR 58 BPM, incomplete right bundle branch block  Recent Labs: 11/08/2022: BUN 12; Creatinine, Ser 1.09; Hemoglobin 14.1; Platelets 178; Potassium 4.5; Sodium 139  Recent Lipid Panel No results found for: "CHOL", "TRIG", "HDL", "CHOLHDL", "VLDL", "LDLCALC", "LDLDIRECT"   Risk Assessment/Calculations:                Physical Exam:    VS:  BP 124/72 (BP Location: Right Arm, Patient Position: Sitting, Cuff Size: Large)   Pulse (!) 58   Ht 5' 4"$  (1.626 m)   Wt 236 lb (107 kg)   BMI 40.51 kg/m     Wt Readings from Last 3 Encounters:  11/10/22 236 lb (107 kg)  11/08/22 231 lb (104.8 kg)  12/21/21 243 lb 12.8 oz (110.6 kg)     GEN:  Well nourished, well developed in no acute distress. Sitting comfortably on the exam table  HEENT: Normal NECK: No JVD with head elevated to 30 degrees; No carotid bruits CARDIAC: RRR, no murmurs, rubs, gallops. Radial pulses 2+ bilaterally  RESPIRATORY:  Clear to auscultation without rales, wheezing or rhonchi. Normal WOB on room air  ABDOMEN: Soft, non-tender, non-distended MUSCULOSKELETAL:  No edema in BLE; No deformity  SKIN: Warm and dry NEUROLOGIC:  Alert and oriented x 3 PSYCHIATRIC:  Normal affect   ASSESSMENT:    1. Encounter for pre-operative cardiovascular clearance   2. Coronary artery disease involving native coronary artery of native heart without angina pectoris   3. Essential hypertension   4.  History of DVT (deep vein thrombosis)    PLAN:    In order of problems listed above:  Preoperative Evaluation  - According to the revised cardiac risk index, patient has 1 point and is at a 0.9%  risk of perioperative major cardiac events. Patient's only point comes from his history of ischemic heart disease, and patient only had mild, nonobstructive CAD on cath in 2010 and had a normal nuclear stress test in 2020 - Patient is able to do a 10 minute walk every morning, climb into his truck, live heavy items at work, walk up/down stairs without developing chest pain or SOB. As he is able to perform greater than 4 mets of physical activity without angina, he does not require further cardiac evaluation prior to surgery  - No further cardiac workup planned prior to surgery. I will forward this note to Dr. Alvan Dame (patient's surgeon)  - OK to hold ASA as needed perioperatively (5-7 days prior to surgery, resume when appropriate hemostasis achieved after surgery). Patient reports that he stopped taking aspirin yesterday   Mild, nonobstructive CAD  - Patient had a cardiac catheterization in 2010 that showed 20% stenosis in the proximal RCA. Most recently, nuclease stress test on 11/01/2018 was low risk and showed EF 48%. Today, patient denies chest pain, dyspnea on exertion  - Continue daily ASA postoperatively  - Continue nebivolol 2.5 mg daily  - Lipid panel from 02/2022 showed LDL 71, HDL 45. Patient not on statin therapy   HTN  - BP well controlled (124/72 this appointment) - Continue nebivolol 2.5 mg daily. Patient does report having a HR in the 50s, but is asymptomatic.  - Provided patient with information about low sodium diets   History of DVT  - Patient had a right lower extremity DVT in 2014, was on [place] for 1 year. Post DVT, he continued to have asymmetric R>L lower extremity edema. On lasix 20 mg daily for edema, reports improvement  - Patient did have BMP drawn on 2/12 that showed  creatinine, potassium within normal limits while on lasix  - Today, patient does not have lower extremity edema on exam. Denies issues with lower extremity pain.    Medication Adjustments/Labs and Tests Ordered: Current medicines are reviewed at length with the patient today.  Concerns regarding medicines are outlined above.  Orders Placed This Encounter  Procedures   EKG 12-Lead   No orders of the defined types were placed in this encounter.   Patient Instructions  Medication Instructions:  The current medical regimen is effective;  continue present plan and medications as directed. Please refer to the Current Medication list given to you today.  *If you need a refill on your cardiac medications before your next appointment, please call your pharmacy*  Lab Work: NONE If you have labs (blood work) drawn today and your tests are completely normal, you will receive your results only by: [place] (if you have MyChart) OR  A paper copy in the mail If you have any lab test that is abnormal or we need to change your treatment, we will call you to review the results.  Testing/Procedures: NONE  Follow-Up: At [facility], you and your health needs are our priority.  As part of our continuing mission to provide you with exceptional heart care, we have created designated Provider Care Teams.  These Care Teams include your primary Cardiologist (physician) and Advanced Practice Providers (APPs -  Physician Assistants and Nurse Practitioners) who all work together to provide you with the care you need, when you need it.  Your next appointment:   12 month(s)  Provider:   Glenetta Hew, MD  or Coletta Memos, FNP, Fabian Sharp, PA-C, Sande Rives, PA-C, Vikki Ports, PA-C,  Caron Presume, PA-C, Jory Sims, DNP, ANP, Almyra Deforest, PA-C, Diona Browner, NP, Lily Kocher, PA-C, or Mayra Reel, NP        Other Instructions LOW RISK FOR UPCOMING SURGERY  PLEASE READ  AND FOLLOW LOW SALT DIET-ATTACHED   DASH Eating Plan-LOW SALT DIET DASH stands for Dietary Approaches to Stop Hypertension. The DASH eating plan is a healthy eating plan that has been shown to: Reduce high blood pressure (hypertension). Reduce your risk for type 2 diabetes, heart disease, and stroke. Help with weight loss. What are tips for following this plan? Reading food labels Check food labels for the amount of salt (sodium) per serving. Choose foods with less than 5 percent of the Daily Value of sodium. Generally, foods with less than 300 milligrams (mg) of sodium per serving fit into this eating plan. To find whole grains, look for the word "whole" as the first word in the ingredient list. Shopping Buy products labeled as "low-sodium" or "no salt added." Buy fresh foods. Avoid canned foods and pre-made or frozen meals. Cooking Avoid adding salt when cooking. Use salt-free seasonings or herbs instead of table salt or sea salt. Check with your health care provider or pharmacist before using salt substitutes. Do not fry foods. Cook foods using healthy methods such as baking, boiling, grilling, roasting, and broiling instead. Cook with heart-healthy oils, such as olive, canola, avocado, soybean, or sunflower oil. Meal planning  Eat a balanced diet that includes: 4 or more servings of fruits and 4 or more servings of vegetables each day. Try to fill one-half of your plate with fruits and vegetables. 6-8 servings of whole grains each day. Less than 6 oz (170 g) of lean meat, poultry, or fish each day. A 3-oz (85-g) serving of meat is about the same size as a deck of cards. One egg equals 1 oz (28 g). 2-3 servings of low-fat dairy each day. One serving is 1 cup (237 mL). 1 serving of nuts, seeds, or beans 5 times each week. 2-3 servings of heart-healthy fats. Healthy fats called omega-3 fatty acids are found in foods such as walnuts, flaxseeds, fortified milks, and eggs. These fats are  also found in cold-water fish, such as sardines, salmon, and mackerel. Limit how much you eat of: Canned or prepackaged foods. Food that is high in trans fat, such as some fried foods. Food that is high in saturated fat, such as fatty meat. Desserts and other sweets, sugary drinks, and other foods with added sugar. Full-fat dairy products. Do not salt foods before eating. Do not eat more than 4 egg yolks a week. Try to eat at least 2 vegetarian meals a week. Eat more home-cooked food and less restaurant, buffet, and fast food. Lifestyle When eating at a restaurant, ask that your food be prepared with less salt or no salt, if possible. If you drink alcohol: Limit how much you use to: 0-1 drink a day for women who are not pregnant. 0-2 drinks a day for men. Be aware of how much alcohol is in your drink. In the [place] one drink equals one 12 oz bottle of beer (355 mL), one 5 oz glass of wine (148 mL), or one 1 oz glass of hard liquor (44 mL). General information Avoid eating more than 2,300 mg of salt a day. If you have hypertension, you may need to reduce your sodium intake to 1,500 mg a day. Work with your health care provider to maintain a healthy body weight or to  lose weight. Ask what an ideal weight is for you. Get at least 30 minutes of exercise that causes your heart to beat faster (aerobic exercise) most days of the week. Activities may include walking, swimming, or biking. Work with your health care provider or dietitian to adjust your eating plan to your individual calorie needs. What foods should I eat? Fruits All fresh, dried, or frozen fruit. Canned fruit in natural juice (without added sugar). Vegetables Fresh or frozen vegetables (raw, steamed, roasted, or grilled). Low-sodium or reduced-sodium tomato and vegetable juice. Low-sodium or reduced-sodium tomato sauce and tomato paste. Low-sodium or reduced-sodium canned vegetables. Grains Whole-grain or whole-wheat bread.  Whole-grain or whole-wheat pasta. Brown rice. [place]. Bulgur. Whole-grain and low-sodium cereals. Pita bread. Low-fat, low-sodium crackers. Whole-wheat flour tortillas. Meats and other proteins Skinless chicken or [place]. Ground chicken or [place]. Pork with fat trimmed off. Fish and seafood. Egg whites. Dried beans, peas, or lentils. Unsalted nuts, nut butters, and seeds. Unsalted canned beans. Lean cuts of beef with fat trimmed off. Low-sodium, lean precooked or cured meat, such as sausages or meat loaves. Dairy Low-fat (1%) or fat-free (skim) milk. Reduced-fat, low-fat, or fat-free cheeses. Nonfat, low-sodium ricotta or cottage cheese. Low-fat or nonfat yogurt. Low-fat, low-sodium cheese. Fats and oils Soft margarine without trans fats. Vegetable oil. Reduced-fat, low-fat, or light mayonnaise and salad dressings (reduced-sodium). Canola, safflower, olive, avocado, soybean, and sunflower oils. Avocado. Seasonings and condiments Herbs. Spices. Seasoning mixes without salt. Other foods Unsalted popcorn and pretzels. Fat-free sweets. The items listed above may not be a complete list of foods and beverages you can eat. Contact a dietitian for more information. What foods should I avoid? Fruits Canned fruit in a light or heavy syrup. Fried fruit. Fruit in cream or butter sauce. Vegetables Creamed or fried vegetables. Vegetables in a cheese sauce. Regular canned vegetables (not low-sodium or reduced-sodium). Regular canned tomato sauce and paste (not low-sodium or reduced-sodium). Regular tomato and vegetable juice (not low-sodium or reduced-sodium). Angie Fava. Olives. Grains Baked goods made with fat, such as croissants, muffins, or some breads. Dry pasta or rice meal packs. Meats and other proteins Fatty cuts of meat. Ribs. Fried meat. Berniece Salines. Bologna, salami, and other precooked or cured meats, such as sausages or meat loaves. Fat from the back of a pig (fatback). Bratwurst. Salted nuts and  seeds. Canned beans with added salt. Canned or smoked fish. Whole eggs or egg yolks. Chicken or [place] with skin. Dairy Whole or 2% milk, cream, and half-and-half. Whole or full-fat cream cheese. Whole-fat or sweetened yogurt. Full-fat cheese. Nondairy creamers. Whipped toppings. Processed cheese and cheese spreads. Fats and oils Butter. Stick margarine. Lard. Shortening. Ghee. Bacon fat. Tropical oils, such as coconut, palm kernel, or palm oil. Seasonings and condiments Onion salt, garlic salt, seasoned salt, table salt, and sea salt. Worcestershire sauce. Tartar sauce. Barbecue sauce. Teriyaki sauce. Soy sauce, including reduced-sodium. Steak sauce. Canned and packaged gravies. Fish sauce. Oyster sauce. Cocktail sauce. Store-bought horseradish. Ketchup. Mustard. Meat flavorings and tenderizers. Bouillon cubes. Hot sauces. Pre-made or packaged marinades. Pre-made or packaged taco seasonings. Relishes. Regular salad dressings. Other foods Salted popcorn and pretzels. The items listed above may not be a complete list of foods and beverages you should avoid. Contact a dietitian for more information. Where to find more information National Heart, Lung, and Blood Institute: https://wilson-eaton.com/ American Heart Association: www.heart.org Academy of Nutrition and Dietetics: www.eatright.[place]: www.kidney.org Summary The DASH eating plan is a healthy eating plan that has been  shown to reduce high blood pressure (hypertension). It may also reduce your risk for type 2 diabetes, heart disease, and stroke. When on the DASH eating plan, aim to eat more fresh fruits and vegetables, whole grains, lean proteins, low-fat dairy, and heart-healthy fats. With the DASH eating plan, you should limit salt (sodium) intake to 2,300 mg a day. If you have hypertension, you may need to reduce your sodium intake to 1,500 mg a day. Work with your health care provider or dietitian to adjust your eating  plan to your individual calorie needs. This information is not intended to replace advice given to you by your health care provider. Make sure you discuss any questions you have with your health care provider. Document Revised: 08/17/2019 Document Reviewed: 08/17/2019 Elsevier Patient Education  [place], Jediah Gallis, [place]  11/10/2022 8:29 AM    [place]

## 2022-11-10 ENCOUNTER — Ambulatory Visit: Payer: BC Managed Care – PPO | Attending: General Practice | Admitting: Cardiology

## 2022-11-10 ENCOUNTER — Encounter: Payer: Self-pay | Admitting: General Practice

## 2022-11-10 VITALS — BP 124/72 | HR 58 | Ht 64.0 in | Wt 236.0 lb

## 2022-11-10 DIAGNOSIS — I10 Essential (primary) hypertension: Secondary | ICD-10-CM

## 2022-11-10 DIAGNOSIS — Z86718 Personal history of other venous thrombosis and embolism: Secondary | ICD-10-CM | POA: Diagnosis not present

## 2022-11-10 DIAGNOSIS — Z01810 Encounter for preprocedural cardiovascular examination: Secondary | ICD-10-CM | POA: Diagnosis not present

## 2022-11-10 DIAGNOSIS — I2510 Atherosclerotic heart disease of native coronary artery without angina pectoris: Secondary | ICD-10-CM | POA: Diagnosis not present

## 2022-11-10 NOTE — Patient Instructions (Signed)
Medication Instructions:  The current medical regimen is effective;  continue present plan and medications as directed. Please refer to the Current Medication list given to you today.  *If you need a refill on your cardiac medications before your next appointment, please call your pharmacy*  Lab Work: NONE If you have labs (blood work) drawn today and your tests are completely normal, you will receive your results only by: [place] (if you have MyChart) OR  A paper copy in the mail If you have any lab test that is abnormal or we need to change your treatment, we will call you to review the results.  Testing/Procedures: NONE  Follow-Up: At [facility], you and your health needs are our priority.  As part of our continuing mission to provide you with exceptional heart care, we have created designated Provider Care Teams.  These Care Teams include your primary Cardiologist (physician) and Advanced Practice Providers (APPs -  Physician Assistants and Nurse Practitioners) who all work together to provide you with the care you need, when you need it.  Your next appointment:   12 month(s)  Provider:   Glenetta Hew, MD  or Coletta Memos, FNP, Fabian Sharp, PA-C, Sande Rives, PA-C, Vikki Ports, PA-C, Caron Presume, PA-C, Jory Sims, DNP, ANP, Almyra Deforest, PA-C, Diona Browner, NP, Lily Kocher, PA-C, or Mayra Reel, NP        Other Instructions LOW RISK FOR UPCOMING SURGERY  PLEASE READ AND FOLLOW LOW SALT DIET-ATTACHED   DASH Eating Plan-LOW SALT DIET DASH stands for Dietary Approaches to Stop Hypertension. The DASH eating plan is a healthy eating plan that has been shown to: Reduce high blood pressure (hypertension). Reduce your risk for type 2 diabetes, heart disease, and stroke. Help with weight loss. What are tips for following this plan? Reading food labels Check food labels for the amount of salt (sodium) per serving. Choose foods with less than  5 percent of the Daily Value of sodium. Generally, foods with less than 300 milligrams (mg) of sodium per serving fit into this eating plan. To find whole grains, look for the word "whole" as the first word in the ingredient list. Shopping Buy products labeled as "low-sodium" or "no salt added." Buy fresh foods. Avoid canned foods and pre-made or frozen meals. Cooking Avoid adding salt when cooking. Use salt-free seasonings or herbs instead of table salt or sea salt. Check with your health care provider or pharmacist before using salt substitutes. Do not fry foods. Cook foods using healthy methods such as baking, boiling, grilling, roasting, and broiling instead. Cook with heart-healthy oils, such as olive, canola, avocado, soybean, or sunflower oil. Meal planning  Eat a balanced diet that includes: 4 or more servings of fruits and 4 or more servings of vegetables each day. Try to fill one-half of your plate with fruits and vegetables. 6-8 servings of whole grains each day. Less than 6 oz (170 g) of lean meat, poultry, or fish each day. A 3-oz (85-g) serving of meat is about the same size as a deck of cards. One egg equals 1 oz (28 g). 2-3 servings of low-fat dairy each day. One serving is 1 cup (237 mL). 1 serving of nuts, seeds, or beans 5 times each week. 2-3 servings of heart-healthy fats. Healthy fats called omega-3 fatty acids are found in foods such as walnuts, flaxseeds, fortified milks, and eggs. These fats are also found in cold-water fish, such as sardines, salmon, and mackerel. Limit how much you  eat of: Canned or prepackaged foods. Food that is high in trans fat, such as some fried foods. Food that is high in saturated fat, such as fatty meat. Desserts and other sweets, sugary drinks, and other foods with added sugar. Full-fat dairy products. Do not salt foods before eating. Do not eat more than 4 egg yolks a week. Try to eat at least 2 vegetarian meals a week. Eat more  home-cooked food and less restaurant, buffet, and fast food. Lifestyle When eating at a restaurant, ask that your food be prepared with less salt or no salt, if possible. If you drink alcohol: Limit how much you use to: 0-1 drink a day for women who are not pregnant. 0-2 drinks a day for men. Be aware of how much alcohol is in your drink. In the [place] one drink equals one 12 oz bottle of beer (355 mL), one 5 oz glass of wine (148 mL), or one 1 oz glass of hard liquor (44 mL). General information Avoid eating more than 2,300 mg of salt a day. If you have hypertension, you may need to reduce your sodium intake to 1,500 mg a day. Work with your health care provider to maintain a healthy body weight or to lose weight. Ask what an ideal weight is for you. Get at least 30 minutes of exercise that causes your heart to beat faster (aerobic exercise) most days of the week. Activities may include walking, swimming, or biking. Work with your health care provider or dietitian to adjust your eating plan to your individual calorie needs. What foods should I eat? Fruits All fresh, dried, or frozen fruit. Canned fruit in natural juice (without added sugar). Vegetables Fresh or frozen vegetables (raw, steamed, roasted, or grilled). Low-sodium or reduced-sodium tomato and vegetable juice. Low-sodium or reduced-sodium tomato sauce and tomato paste. Low-sodium or reduced-sodium canned vegetables. Grains Whole-grain or whole-wheat bread. Whole-grain or whole-wheat pasta. Brown rice. [place]. Bulgur. Whole-grain and low-sodium cereals. Pita bread. Low-fat, low-sodium crackers. Whole-wheat flour tortillas. Meats and other proteins Skinless chicken or [place]. Ground chicken or [place]. Pork with fat trimmed off. Fish and seafood. Egg whites. Dried beans, peas, or lentils. Unsalted nuts, nut butters, and seeds. Unsalted canned beans. Lean cuts of beef with fat trimmed off. Low-sodium, lean precooked or cured  meat, such as sausages or meat loaves. Dairy Low-fat (1%) or fat-free (skim) milk. Reduced-fat, low-fat, or fat-free cheeses. Nonfat, low-sodium ricotta or cottage cheese. Low-fat or nonfat yogurt. Low-fat, low-sodium cheese. Fats and oils Soft margarine without trans fats. Vegetable oil. Reduced-fat, low-fat, or light mayonnaise and salad dressings (reduced-sodium). Canola, safflower, olive, avocado, soybean, and sunflower oils. Avocado. Seasonings and condiments Herbs. Spices. Seasoning mixes without salt. Other foods Unsalted popcorn and pretzels. Fat-free sweets. The items listed above may not be a complete list of foods and beverages you can eat. Contact a dietitian for more information. What foods should I avoid? Fruits Canned fruit in a light or heavy syrup. Fried fruit. Fruit in cream or butter sauce. Vegetables Creamed or fried vegetables. Vegetables in a cheese sauce. Regular canned vegetables (not low-sodium or reduced-sodium). Regular canned tomato sauce and paste (not low-sodium or reduced-sodium). Regular tomato and vegetable juice (not low-sodium or reduced-sodium). Angie Fava. Olives. Grains Baked goods made with fat, such as croissants, muffins, or some breads. Dry pasta or rice meal packs. Meats and other proteins Fatty cuts of meat. Ribs. Fried meat. Berniece Salines. Bologna, salami, and other precooked or cured meats, such as sausages or meat loaves.  Fat from the back of a pig (fatback). Bratwurst. Salted nuts and seeds. Canned beans with added salt. Canned or smoked fish. Whole eggs or egg yolks. Chicken or [place] with skin. Dairy Whole or 2% milk, cream, and half-and-half. Whole or full-fat cream cheese. Whole-fat or sweetened yogurt. Full-fat cheese. Nondairy creamers. Whipped toppings. Processed cheese and cheese spreads. Fats and oils Butter. Stick margarine. Lard. Shortening. Ghee. Bacon fat. Tropical oils, such as coconut, palm kernel, or palm oil. Seasonings and  condiments Onion salt, garlic salt, seasoned salt, table salt, and sea salt. Worcestershire sauce. Tartar sauce. Barbecue sauce. Teriyaki sauce. Soy sauce, including reduced-sodium. Steak sauce. Canned and packaged gravies. Fish sauce. Oyster sauce. Cocktail sauce. Store-bought horseradish. Ketchup. Mustard. Meat flavorings and tenderizers. Bouillon cubes. Hot sauces. Pre-made or packaged marinades. Pre-made or packaged taco seasonings. Relishes. Regular salad dressings. Other foods Salted popcorn and pretzels. The items listed above may not be a complete list of foods and beverages you should avoid. Contact a dietitian for more information. Where to find more information National Heart, Lung, and Blood Institute: https://wilson-eaton.com/ American Heart Association: www.heart.org Academy of Nutrition and Dietetics: www.eatright.[place]: www.kidney.org Summary The DASH eating plan is a healthy eating plan that has been shown to reduce high blood pressure (hypertension). It may also reduce your risk for type 2 diabetes, heart disease, and stroke. When on the DASH eating plan, aim to eat more fresh fruits and vegetables, whole grains, lean proteins, low-fat dairy, and heart-healthy fats. With the DASH eating plan, you should limit salt (sodium) intake to 2,300 mg a day. If you have hypertension, you may need to reduce your sodium intake to 1,500 mg a day. Work with your health care provider or dietitian to adjust your eating plan to your individual calorie needs. This information is not intended to replace advice given to you by your health care provider. Make sure you discuss any questions you have with your health care provider. Document Revised: 08/17/2019 Document Reviewed: 08/17/2019 Elsevier Patient Education  Green.

## 2022-11-11 NOTE — Progress Notes (Signed)
Anesthesia Chart Review   Case: [id number] Date/Time: 11/16/22 0700   Procedure: TOTAL KNEE ARTHROPLASTY (Right: Knee)   Anesthesia type: Spinal   Pre-op diagnosis: Right knee osteoarthritis   Location: [facility] ROOM 09 / [facility]   Surgeons: Paralee Cancel, MD       DISCUSSION:72 y.o. never smoker with h/o HTN, PVD, right knee OA scheduled for above procedure 11/16/22 with Dr. Paralee Cancel.   Pt last seen by cardiology 11/10/2022. Per OV note, "- According to the revised cardiac risk index, patient has 1 point and is at a 0.9% risk of perioperative major cardiac events. Patient's only point comes from his history of ischemic heart disease, and patient only had mild, nonobstructive CAD on cath in 2010 and had a normal nuclear stress test in 2020 - Patient is able to do a 10 minute walk every morning, climb into his truck, live heavy items at work, walk up/down stairs without developing chest pain or SOB. As he is able to perform greater than 4 mets of physical activity without angina, he does not require further cardiac evaluation prior to surgery  - No further cardiac workup planned prior to surgery. I will forward this note to Dr. Alvan Dame (patient's surgeon)  - OK to hold ASA as needed perioperatively (5-7 days prior to surgery, resume when appropriate hemostasis achieved after surgery). Patient reports that he stopped taking aspirin yesterday"  Anticipate pt can proceed with planned procedure barring acute status change.     VS: BP 139/82   Pulse (!) 51   Temp 36.7 C (Oral)   Resp 16   Ht 5' 4"$  (1.626 m)   Wt 104.8 kg   SpO2 99%   BMI 39.65 kg/m   PROVIDERS: Seward Carol, MD is PCP   Cardiologist:  Glenetta Hew, MD {  LABS: Labs reviewed: Acceptable for surgery. (all labs ordered are listed, but only abnormal results are displayed)  Labs Reviewed  BASIC METABOLIC PANEL - Abnormal; Notable for the following components:      Result Value   Glucose, Bld 113 (*)    All other components  within normal limits  HEMOGLOBIN A1C - Abnormal; Notable for the following components:   Hgb A1c MFr Bld 5.8 (*)    All other components within normal limits  GLUCOSE, CAPILLARY - Abnormal; Notable for the following components:   Glucose-Capillary 109 (*)    All other components within normal limits  SURGICAL PCR SCREEN  CBC     IMAGES:   EKG:   CV: Myocardial Perfusion 11/01/2018 The left ventricular ejection fraction is mildly decreased (45-54%). Nuclear stress EF: 48%. There was no ST segment deviation noted during stress. There is a small defect of mild severity present in the basal inferior location. The defect is non-reversible and consistent with diaphragmatic attenuation artifact. There is a small defect of mild severity present in the apex location. The defect is reversible and could represent a small area of ischemia vs. Variations in diaphragmatic attenuation artifact. This is a low risk study. Past Medical History:  Diagnosis Date   Complication of anesthesia    shortness of breath with colonoscopy but not with most recent one   DVT (deep venous thrombosis) (HCC)    Hypertension    Hypogonadism male    OA (osteoarthritis) of hip    s/p R THR   Osteoarthritis of both knees    Peripheral vascular disease ([place])    Pre-diabetes     Past Surgical History:  Procedure  Laterality Date   CARDIAC CATHETERIZATION  2006   no blockage   CARDIAC CATHETERIZATION  10/30/2008   CARPAL TUNNEL RELEASE  2009   Left   HEMORRHOIDECTOMY WITH HEMORRHOID BANDING     KNEE ARTHROSCOPY     NECK SURGERY     TOTAL HIP ARTHROPLASTY Right 07/2017   Dr Alvan Dame, at [place] Left 06/25/2021   Procedure: TOTAL KNEE ARTHROPLASTY;  Surgeon: Paralee Cancel, MD;  Location: [facility];  Service: Orthopedics;  Laterality: Left;   Venous Doppler, lower extremity Right 05/18/2013   Chronic appearing DVT with some recanalization involving the femoral vein, posterior  tibial and peroneal veins. There does appear to be partial resolution of the common femoral vein and popliteal pain thrombus with residual findings distally.    MEDICATIONS:  aspirin EC 81 MG tablet   carboxymethylcellulose (REFRESH PLUS) 0.5 % SOLN   celecoxib (CELEBREX) 200 MG capsule   clindamycin (CLEOCIN) 300 MG capsule   diclofenac Sodium (VOLTAREN) 1 % GEL   Fluocinolone Acetonide 0.01 % OIL   furosemide (LASIX) 20 MG tablet   gabapentin (NEURONTIN) 100 MG capsule   Multiple Vitamin (MULTIVITAMIN WITH MINERALS) TABS   nebivolol (BYSTOLIC) 5 MG tablet   OVER THE COUNTER MEDICATION   sildenafil (REVATIO) 20 MG tablet   tadalafil (CIALIS) 5 MG tablet   tamsulosin (FLOMAX) 0.4 MG CAPS capsule   No current facility-administered medications for this encounter.     Konrad Felix Ward, PA-C [facility] Pre-Surgical Testing [phone]

## 2022-11-11 NOTE — Anesthesia Preprocedure Evaluation (Addendum)
Anesthesia Evaluation  Patient identified by MRN, date of birth, ID band Patient awake    Reviewed: Allergy & Precautions, NPO status , Patient's Chart, lab work & pertinent test results  History of Anesthesia Complications Negative for: history of anesthetic complications  Airway Mallampati: I  TM Distance: >3 FB Neck ROM: Full    Dental no notable dental hx.    Pulmonary PE   Pulmonary exam normal        Cardiovascular hypertension, Pt. on medications and Pt. on home beta blockers + Peripheral Vascular Disease  Normal cardiovascular exam  Myocardial Perfusion 11/01/2018: EF 48%, small defect of mild severity present in the basal inferior location, non-reversible and consistent with diaphragmatic attenuation artifact, small defect of mild severity present in the apex location, reversible and could represent a small area of ischemia vs. Variations in diaphragmatic attenuation artifact    Neuro/Psych    GI/Hepatic   Endo/Other    Renal/GU      Musculoskeletal  (+) Arthritis ,    Abdominal   Peds  Hematology   Anesthesia Other Findings   Reproductive/Obstetrics                              Anesthesia Physical Anesthesia Plan  ASA: 3  Anesthesia Plan: MAC and Spinal   Post-op Pain Management: Minimal or no pain anticipated   Induction:   PONV Risk Score and Plan: 2 and Treatment may vary due to age or medical condition, Propofol infusion, Ondansetron and Dexamethasone  Airway Management Planned: Natural Airway and Simple Face Mask  Additional Equipment: None  Intra-op Plan:   Post-operative Plan:   Informed Consent: I have reviewed the patients History and Physical, chart, labs and discussed the procedure including the risks, benefits and alternatives for the proposed anesthesia with the patient or authorized representative who has indicated his/her understanding and acceptance.        Plan Discussed with: CRNA  Anesthesia Plan Comments: (See PAT note 11/08/2022)        Anesthesia [place] Evaluation

## 2022-11-16 ENCOUNTER — Observation Stay (HOSPITAL_COMMUNITY)
Admission: RE | Admit: 2022-11-16 | Discharge: 2022-11-17 | Disposition: A | Discharge status: Home / Self Care | Payer: BC Managed Care – PPO | Source: Ambulatory Visit | Attending: Orthopedic Surgery | Admitting: Orthopedic Surgery

## 2022-11-16 ENCOUNTER — Ambulatory Visit (HOSPITAL_COMMUNITY): Payer: BC Managed Care – PPO

## 2022-11-16 ENCOUNTER — Encounter (HOSPITAL_COMMUNITY): Payer: Self-pay | Admitting: Orthopedic Surgery

## 2022-11-16 ENCOUNTER — Other Ambulatory Visit: Payer: Self-pay

## 2022-11-16 ENCOUNTER — Ambulatory Visit (HOSPITAL_COMMUNITY): Payer: BC Managed Care – PPO | Admitting: Anesthesiology

## 2022-11-16 ENCOUNTER — Ambulatory Visit (HOSPITAL_COMMUNITY): Payer: BC Managed Care – PPO | Admitting: Physician Assistant

## 2022-11-16 ENCOUNTER — Encounter (HOSPITAL_COMMUNITY)
Admission: RE | Disposition: A | Discharge status: Home / Self Care | Payer: Self-pay | Source: Ambulatory Visit | Attending: Orthopedic Surgery

## 2022-11-16 DIAGNOSIS — Z96652 Presence of left artificial knee joint: Secondary | ICD-10-CM | POA: Diagnosis not present

## 2022-11-16 DIAGNOSIS — M25461 Effusion, right knee: Secondary | ICD-10-CM | POA: Diagnosis not present

## 2022-11-16 DIAGNOSIS — M1711 Unilateral primary osteoarthritis, right knee: Principal | ICD-10-CM | POA: Insufficient documentation

## 2022-11-16 DIAGNOSIS — Z79899 Other long term (current) drug therapy: Secondary | ICD-10-CM | POA: Insufficient documentation

## 2022-11-16 DIAGNOSIS — M25761 Osteophyte, right knee: Secondary | ICD-10-CM | POA: Diagnosis not present

## 2022-11-16 DIAGNOSIS — Z86718 Personal history of other venous thrombosis and embolism: Secondary | ICD-10-CM | POA: Insufficient documentation

## 2022-11-16 DIAGNOSIS — Z96641 Presence of right artificial hip joint: Secondary | ICD-10-CM | POA: Insufficient documentation

## 2022-11-16 DIAGNOSIS — G8918 Other acute postprocedural pain: Secondary | ICD-10-CM | POA: Diagnosis not present

## 2022-11-16 DIAGNOSIS — Z96651 Presence of right artificial knee joint: Secondary | ICD-10-CM

## 2022-11-16 DIAGNOSIS — I10 Essential (primary) hypertension: Secondary | ICD-10-CM | POA: Diagnosis not present

## 2022-11-16 HISTORY — PX: TOTAL KNEE ARTHROPLASTY: SHX125

## 2022-11-16 SURGERY — ARTHROPLASTY, KNEE, TOTAL
Anesthesia: Monitor Anesthesia Care | Site: Knee | Laterality: Right

## 2022-11-16 MED ORDER — HYDROMORPHONE HCL 1 MG/ML IJ SOLN: 0.5000 mg | INTRAMUSCULAR | Status: DC | PRN

## 2022-11-16 MED ORDER — BUPIVACAINE-EPINEPHRINE (PF) 0.5% -1:200000 IJ SOLN
INTRAMUSCULAR | Status: DC | PRN
  Administered 2022-11-16: 15 mL via PERINEURAL

## 2022-11-16 MED ORDER — ONDANSETRON HCL 4 MG/2ML IJ SOLN
INTRAMUSCULAR | Status: DC | PRN
  Administered 2022-11-16: 4 mg via INTRAVENOUS

## 2022-11-16 MED ORDER — CEFAZOLIN SODIUM-DEXTROSE 2-4 GM/100ML-% IV SOLN
2.0000 g | INTRAVENOUS | Status: AC
  Administered 2022-11-16: 2 g via INTRAVENOUS
  Filled 2022-11-16: qty 100

## 2022-11-16 MED ORDER — PROPOFOL 10 MG/ML IV BOLUS
INTRAVENOUS | Status: AC
  Filled 2022-11-16: qty 20

## 2022-11-16 MED ORDER — PROPOFOL 10 MG/ML IV BOLUS
INTRAVENOUS | Status: DC | PRN
  Administered 2022-11-16: 20 mg via INTRAVENOUS

## 2022-11-16 MED ORDER — MIDAZOLAM HCL 2 MG/2ML IJ SOLN
INTRAMUSCULAR | Status: AC
  Filled 2022-11-16: qty 2

## 2022-11-16 MED ORDER — METHOCARBAMOL 500 MG PO TABS
500.0000 mg | ORAL_TABLET | Freq: Four times a day (QID) | ORAL | Status: DC | PRN
  Administered 2022-11-16 – 2022-11-17 (×2): 500 mg via ORAL
  Filled 2022-11-16 (×2): qty 1

## 2022-11-16 MED ORDER — ACETAMINOPHEN 500 MG PO TABS
1000.0000 mg | ORAL_TABLET | Freq: Once | ORAL | Status: AC
  Administered 2022-11-16: 1000 mg via ORAL
  Filled 2022-11-16: qty 2

## 2022-11-16 MED ORDER — METOCLOPRAMIDE HCL 5 MG/ML IJ SOLN: 5.0000 mg | Freq: Three times a day (TID) | INTRAMUSCULAR | Status: DC | PRN

## 2022-11-16 MED ORDER — BUPIVACAINE-EPINEPHRINE (PF) 0.25% -1:200000 IJ SOLN
INTRAMUSCULAR | Status: DC | PRN
  Administered 2022-11-16: 30 mL

## 2022-11-16 MED ORDER — ACETAMINOPHEN 500 MG PO TABS
1000.0000 mg | ORAL_TABLET | Freq: Four times a day (QID) | ORAL | Status: AC
  Administered 2022-11-16 – 2022-11-17 (×4): 1000 mg via ORAL
  Filled 2022-11-16 (×4): qty 2

## 2022-11-16 MED ORDER — TRANEXAMIC ACID-NACL 1000-0.7 MG/100ML-% IV SOLN
1000.0000 mg | INTRAVENOUS | Status: AC
  Administered 2022-11-16: 1000 mg via INTRAVENOUS
  Filled 2022-11-16: qty 100

## 2022-11-16 MED ORDER — 0.9 % SODIUM CHLORIDE (POUR BTL) OPTIME
TOPICAL | Status: DC | PRN
  Administered 2022-11-16: 1000 mL

## 2022-11-16 MED ORDER — BISACODYL 10 MG RE SUPP: 10.0000 mg | Freq: Every day | RECTAL | Status: DC | PRN

## 2022-11-16 MED ORDER — OXYCODONE HCL 5 MG PO TABS: 5.0000 mg | ORAL_TABLET | Freq: Once | ORAL | Status: DC | PRN

## 2022-11-16 MED ORDER — RIVAROXABAN 10 MG PO TABS
10.0000 mg | ORAL_TABLET | Freq: Every day | ORAL | Status: DC
  Administered 2022-11-17: 10 mg via ORAL
  Filled 2022-11-16: qty 1

## 2022-11-16 MED ORDER — OXYCODONE HCL 5 MG PO TABS
5.0000 mg | ORAL_TABLET | ORAL | Status: DC | PRN
  Administered 2022-11-16 – 2022-11-17 (×5): 10 mg via ORAL
  Filled 2022-11-16 (×5): qty 2

## 2022-11-16 MED ORDER — MENTHOL 3 MG MT LOZG: 1.0000 | LOZENGE | OROMUCOSAL | Status: DC | PRN

## 2022-11-16 MED ORDER — DEXAMETHASONE SODIUM PHOSPHATE 10 MG/ML IJ SOLN: 8.0000 mg | Freq: Once | INTRAMUSCULAR | Status: DC

## 2022-11-16 MED ORDER — LACTATED RINGERS IV SOLN
INTRAVENOUS | Status: DC
Start: 2022-11-16 — End: 2022-11-16

## 2022-11-16 MED ORDER — LACTATED RINGERS IV SOLN: INTRAVENOUS | Status: DC

## 2022-11-16 MED ORDER — DIPHENHYDRAMINE HCL 12.5 MG/5ML PO ELIX: 12.5000 mg | ORAL_SOLUTION | ORAL | Status: DC | PRN

## 2022-11-16 MED ORDER — TAMSULOSIN HCL 0.4 MG PO CAPS
0.4000 mg | ORAL_CAPSULE | Freq: Every day | ORAL | Status: DC
  Administered 2022-11-16 – 2022-11-17 (×2): 0.4 mg via ORAL
  Filled 2022-11-16 (×2): qty 1

## 2022-11-16 MED ORDER — CLONIDINE HCL (ANALGESIA) 100 MCG/ML EP SOLN
EPIDURAL | Status: DC | PRN
  Administered 2022-11-16: 100 ug

## 2022-11-16 MED ORDER — SODIUM CHLORIDE 0.9 % IR SOLN
Status: DC | PRN
  Administered 2022-11-16: 1000 mL

## 2022-11-16 MED ORDER — PHENYLEPHRINE HCL-NACL 20-0.9 MG/250ML-% IV SOLN
INTRAVENOUS | Status: DC | PRN
  Administered 2022-11-16: 50 ug/min via INTRAVENOUS

## 2022-11-16 MED ORDER — CHLORHEXIDINE GLUCONATE 0.12 % MT SOLN
15.0000 mL | Freq: Once | OROMUCOSAL | Status: AC
  Administered 2022-11-16: 15 mL via OROMUCOSAL

## 2022-11-16 MED ORDER — CEFAZOLIN SODIUM-DEXTROSE 2-4 GM/100ML-% IV SOLN
2.0000 g | Freq: Four times a day (QID) | INTRAVENOUS | Status: AC
  Administered 2022-11-16 (×2): 2 g via INTRAVENOUS
  Filled 2022-11-16 (×2): qty 100

## 2022-11-16 MED ORDER — POVIDONE-IODINE 10 % EX SWAB: 2.0000 | Freq: Once | CUTANEOUS | Status: DC

## 2022-11-16 MED ORDER — PHENOL 1.4 % MT LIQD: 1.0000 | OROMUCOSAL | Status: DC | PRN

## 2022-11-16 MED ORDER — ONDANSETRON HCL 4 MG/2ML IJ SOLN: 4.0000 mg | Freq: Four times a day (QID) | INTRAMUSCULAR | Status: DC | PRN

## 2022-11-16 MED ORDER — METOCLOPRAMIDE HCL 5 MG PO TABS: 5.0000 mg | ORAL_TABLET | Freq: Three times a day (TID) | ORAL | Status: DC | PRN

## 2022-11-16 MED ORDER — ACETAMINOPHEN 325 MG PO TABS: 325.0000 mg | ORAL_TABLET | Freq: Four times a day (QID) | ORAL | Status: DC | PRN

## 2022-11-16 MED ORDER — GABAPENTIN 100 MG PO CAPS
100.0000 mg | ORAL_CAPSULE | Freq: Every day | ORAL | Status: DC
  Administered 2022-11-16: 100 mg via ORAL
  Filled 2022-11-16: qty 1

## 2022-11-16 MED ORDER — BUPIVACAINE-EPINEPHRINE (PF) 0.25% -1:200000 IJ SOLN
INTRAMUSCULAR | Status: AC
  Filled 2022-11-16: qty 30

## 2022-11-16 MED ORDER — ONDANSETRON HCL 4 MG/2ML IJ SOLN
INTRAMUSCULAR | Status: AC
  Filled 2022-11-16: qty 2

## 2022-11-16 MED ORDER — STERILE WATER FOR IRRIGATION IR SOLN
Status: DC | PRN
  Administered 2022-11-16: 2000 mL

## 2022-11-16 MED ORDER — KETOROLAC TROMETHAMINE 30 MG/ML IJ SOLN
INTRAMUSCULAR | Status: AC
  Filled 2022-11-16: qty 1

## 2022-11-16 MED ORDER — MIDAZOLAM HCL 5 MG/5ML IJ SOLN
INTRAMUSCULAR | Status: DC | PRN
  Administered 2022-11-16 (×2): 1 mg via INTRAVENOUS

## 2022-11-16 MED ORDER — DEXAMETHASONE SODIUM PHOSPHATE 10 MG/ML IJ SOLN
INTRAMUSCULAR | Status: DC | PRN
  Administered 2022-11-16: 10 mg via INTRAVENOUS

## 2022-11-16 MED ORDER — FUROSEMIDE 20 MG PO TABS
20.0000 mg | ORAL_TABLET | Freq: Every day | ORAL | Status: DC
  Administered 2022-11-17: 20 mg via ORAL
  Filled 2022-11-16: qty 1

## 2022-11-16 MED ORDER — EPHEDRINE 5 MG/ML INJ
INTRAVENOUS | Status: AC
  Filled 2022-11-16: qty 5

## 2022-11-16 MED ORDER — FENTANYL CITRATE (PF) 100 MCG/2ML IJ SOLN
INTRAMUSCULAR | Status: DC | PRN
  Administered 2022-11-16 (×2): 50 ug via INTRAVENOUS

## 2022-11-16 MED ORDER — AMISULPRIDE (ANTIEMETIC) 5 MG/2ML IV SOLN: 10.0000 mg | Freq: Once | INTRAVENOUS | Status: DC | PRN

## 2022-11-16 MED ORDER — PROPOFOL 1000 MG/100ML IV EMUL
INTRAVENOUS | Status: AC
  Filled 2022-11-16: qty 100

## 2022-11-16 MED ORDER — BUPIVACAINE IN DEXTROSE 0.75-8.25 % IT SOLN
INTRATHECAL | Status: DC | PRN
  Administered 2022-11-16: 1.6 mL via INTRATHECAL

## 2022-11-16 MED ORDER — OXYCODONE HCL 5 MG PO TABS: 10.0000 mg | ORAL_TABLET | ORAL | Status: DC | PRN

## 2022-11-16 MED ORDER — SODIUM CHLORIDE 0.9 % IV SOLN: INTRAVENOUS | Status: DC

## 2022-11-16 MED ORDER — FENTANYL CITRATE (PF) 100 MCG/2ML IJ SOLN
INTRAMUSCULAR | Status: AC
  Filled 2022-11-16: qty 2

## 2022-11-16 MED ORDER — LIDOCAINE HCL (PF) 2 % IJ SOLN
INTRAMUSCULAR | Status: AC
  Filled 2022-11-16: qty 5

## 2022-11-16 MED ORDER — SODIUM CHLORIDE (PF) 0.9 % IJ SOLN
INTRAMUSCULAR | Status: AC
  Filled 2022-11-16: qty 30

## 2022-11-16 MED ORDER — TRANEXAMIC ACID-NACL 1000-0.7 MG/100ML-% IV SOLN
1000.0000 mg | Freq: Once | INTRAVENOUS | Status: AC
  Administered 2022-11-16: 1000 mg via INTRAVENOUS
  Filled 2022-11-16: qty 100

## 2022-11-16 MED ORDER — DEXAMETHASONE SODIUM PHOSPHATE 10 MG/ML IJ SOLN
10.0000 mg | Freq: Once | INTRAMUSCULAR | Status: AC
  Administered 2022-11-17: 10 mg via INTRAVENOUS
  Filled 2022-11-16: qty 1

## 2022-11-16 MED ORDER — ORAL CARE MOUTH RINSE: 15.0000 mL | Freq: Once | OROMUCOSAL | Status: AC

## 2022-11-16 MED ORDER — SODIUM CHLORIDE (PF) 0.9 % IJ SOLN
INTRAMUSCULAR | Status: DC | PRN
  Administered 2022-11-16: 30 mL

## 2022-11-16 MED ORDER — NEBIVOLOL HCL 2.5 MG PO TABS
2.5000 mg | ORAL_TABLET | Freq: Every day | ORAL | Status: DC
  Administered 2022-11-17: 2.5 mg via ORAL
  Filled 2022-11-16: qty 1

## 2022-11-16 MED ORDER — DEXAMETHASONE SODIUM PHOSPHATE 10 MG/ML IJ SOLN
INTRAMUSCULAR | Status: AC
  Filled 2022-11-16: qty 1

## 2022-11-16 MED ORDER — FENTANYL CITRATE PF 50 MCG/ML IJ SOSY: 25.0000 ug | PREFILLED_SYRINGE | INTRAMUSCULAR | Status: DC | PRN

## 2022-11-16 MED ORDER — KETOROLAC TROMETHAMINE 30 MG/ML IJ SOLN
INTRAMUSCULAR | Status: DC | PRN
  Administered 2022-11-16: 30 mg

## 2022-11-16 MED ORDER — PROPOFOL 500 MG/50ML IV EMUL
INTRAVENOUS | Status: DC | PRN
  Administered 2022-11-16: 50 ug/kg/min via INTRAVENOUS

## 2022-11-16 MED ORDER — METHOCARBAMOL 500 MG IVPB - SIMPLE MED: 500.0000 mg | Freq: Four times a day (QID) | INTRAVENOUS | Status: DC | PRN

## 2022-11-16 MED ORDER — ONDANSETRON HCL 4 MG PO TABS: 4.0000 mg | ORAL_TABLET | Freq: Four times a day (QID) | ORAL | Status: DC | PRN

## 2022-11-16 MED ORDER — OXYCODONE HCL 5 MG/5ML PO SOLN: 5.0000 mg | Freq: Once | ORAL | Status: DC | PRN

## 2022-11-16 MED ORDER — EPHEDRINE SULFATE-NACL 50-0.9 MG/10ML-% IV SOSY
PREFILLED_SYRINGE | INTRAVENOUS | Status: DC | PRN
  Administered 2022-11-16: 5 mg via INTRAVENOUS

## 2022-11-16 MED ORDER — DOCUSATE SODIUM 100 MG PO CAPS
100.0000 mg | ORAL_CAPSULE | Freq: Two times a day (BID) | ORAL | Status: DC
  Administered 2022-11-16 – 2022-11-17 (×2): 100 mg via ORAL
  Filled 2022-11-16 (×2): qty 1

## 2022-11-16 MED ORDER — POLYETHYLENE GLYCOL 3350 17 G PO PACK
17.0000 g | PACK | Freq: Two times a day (BID) | ORAL | Status: DC
  Administered 2022-11-16 – 2022-11-17 (×3): 17 g via ORAL
  Filled 2022-11-16 (×3): qty 1

## 2022-11-16 SURGICAL SUPPLY — 48 items
ADH SKN CLS APL DERMABOND .7 (GAUZE/BANDAGES/DRESSINGS) ×1
ATTUNE MED ANAT PAT 35 KNEE (Knees) IMPLANT
BASEPLATE TIB CMT FB PCKT SZ6 (Knees) IMPLANT
BLADE SAW SGTL 13.0X1.19X90.0M (BLADE) ×2 IMPLANT
BNDG ELASTIC 4X5.8 VLCR STR LF (GAUZE/BANDAGES/DRESSINGS) IMPLANT
BOWL SMART MIX CTS (DISPOSABLE) ×2 IMPLANT
BSPLAT TIB 6 CMNT FXBRNG STRL (Knees) ×1 IMPLANT
CEMENT HV SMART SET (Cement) IMPLANT
COMP FEM CMT ATTUNE KNEE 5 RT (Joint) ×1 IMPLANT
COMPONENT FEM CMT ATTN KN 5 RT (Joint) IMPLANT
COOLER ICEMAN CLASSIC (MISCELLANEOUS) IMPLANT
COVER SURGICAL LIGHT HANDLE (MISCELLANEOUS) ×2 IMPLANT
CUFF TOURN SGL QUICK 34 (TOURNIQUET CUFF) ×1
CUFF TRNQT CYL 34X4.125X (TOURNIQUET CUFF) ×2 IMPLANT
DERMABOND ADVANCED .7 DNX12 (GAUZE/BANDAGES/DRESSINGS) ×2 IMPLANT
DRAPE U-SHAPE 47X51 STRL (DRAPES) ×2 IMPLANT
DRESSING AQUACEL AG SP 3.5X10 (GAUZE/BANDAGES/DRESSINGS) ×2 IMPLANT
DRSG AQUACEL AG SP 3.5X10 (GAUZE/BANDAGES/DRESSINGS) ×1
DURAPREP 26ML APPLICATOR (WOUND CARE) ×4 IMPLANT
ELECT REM PT RETURN 15FT ADLT (MISCELLANEOUS) ×2 IMPLANT
GLOVE BIO SURGEON STRL SZ 6 (GLOVE) ×2 IMPLANT
GLOVE BIOGEL PI IND STRL 6.5 (GLOVE) ×2 IMPLANT
GLOVE BIOGEL PI IND STRL 7.5 (GLOVE) ×2 IMPLANT
GLOVE ORTHO TXT STRL SZ7.5 (GLOVE) ×4 IMPLANT
GOWN STRL REUS W/ TWL LRG LVL3 (GOWN DISPOSABLE) ×4 IMPLANT
GOWN STRL REUS W/TWL LRG LVL3 (GOWN DISPOSABLE) ×2
HANDPIECE INTERPULSE COAX TIP (DISPOSABLE) ×1
INSERT TIB KNEE ATTUNE 5 6 RT (Insert) IMPLANT
KIT TURNOVER KIT A (KITS) IMPLANT
MANIFOLD NEPTUNE II (INSTRUMENTS) ×2 IMPLANT
NDL SAFETY ECLIP 18X1.5 (MISCELLANEOUS) IMPLANT
NS IRRIG 1000ML POUR BTL (IV SOLUTION) ×2 IMPLANT
PACK TOTAL KNEE CUSTOM (KITS) ×2 IMPLANT
PAD COLD SHLDR WRAP-ON (PAD) IMPLANT
PIN FIX SIGMA LCS THRD HI (PIN) IMPLANT
PROTECTOR NERVE ULNAR (MISCELLANEOUS) ×2 IMPLANT
SET HNDPC FAN SPRY TIP SCT (DISPOSABLE) ×2 IMPLANT
SET PAD KNEE POSITIONER (MISCELLANEOUS) ×2 IMPLANT
SUT MNCRL AB 4-0 PS2 18 (SUTURE) ×2 IMPLANT
SUT STRATAFIX PDS+ 0 24IN (SUTURE) ×2 IMPLANT
SUT VIC AB 1 CT1 36 (SUTURE) ×2 IMPLANT
SUT VIC AB 2-0 CT1 27 (SUTURE) ×2
SUT VIC AB 2-0 CT1 TAPERPNT 27 (SUTURE) ×4 IMPLANT
SYR 3ML LL SCALE MARK (SYRINGE) ×2 IMPLANT
TOWEL GREEN STERILE FF (TOWEL DISPOSABLE) ×2 IMPLANT
TRAY FOLEY MTR SLVR 16FR STAT (SET/KITS/TRAYS/PACK) ×2 IMPLANT
TUBE SUCTION HIGH CAP CLEAR NV (SUCTIONS) ×2 IMPLANT
WATER STERILE IRR 1000ML POUR (IV SOLUTION) ×4 IMPLANT

## 2022-11-16 NOTE — Anesthesia Procedure Notes (Signed)
Date/Time: 11/16/2022 7:17 AM  Performed by: Sharlette Dense, CRNAOxygen Delivery Method: Simple face mask

## 2022-11-16 NOTE — Anesthesia Postprocedure Evaluation (Signed)
Anesthesia Post Note  Patient: Craig [name]  Procedure(s) Performed: TOTAL KNEE ARTHROPLASTY (Right: Knee)     Patient location during evaluation: PACU Anesthesia Type: MAC Level of consciousness: awake and alert Pain management: pain level controlled Vital Signs Assessment: post-procedure vital signs reviewed and stable Respiratory status: spontaneous breathing, nonlabored ventilation and respiratory function stable Cardiovascular status: blood pressure returned to baseline Postop Assessment: no apparent nausea or vomiting Anesthetic complications: no   No notable events documented.  Last Vitals:  Vitals:   11/16/22 1015 11/16/22 1030  BP: 114/62 110/67  Pulse: 64 (!) 57  Resp: 13 17  Temp:    SpO2: 97% 92%    Last Pain:  Vitals:   11/16/22 1030  TempSrc:   PainSc: 0-No pain                 Marthenia Rolling

## 2022-11-16 NOTE — Evaluation (Signed)
Physical Therapy Evaluation Patient Details Name: Craig [name] MRN: [id number] DOB: June 05, 1951 Today's Date: 11/16/2022  History of Present Illness  72 yo male s/p R TKA 11/16/22. Hx of L TKA 2022, R THA 2018, PE/DVT (per pt report)  Clinical Impression  On eval, pt was Min guard A for mobility. He walked ~15 feet with a RW. Pt reported some lightheadedness during session. Moderate pain with activity. Will continue to follow and progress activity as tolerated. Plan is for d/c home on tomorrow.        Recommendations for follow up therapy are one component of a multi-disciplinary discharge planning process, led by the attending physician.  Recommendations may be updated based on patient status, additional functional criteria and insurance authorization.  Follow Up Recommendations Follow physician's recommendations for discharge plan and follow up therapies      Assistance Recommended at Discharge Intermittent Supervision/Assistance  Patient can return home with the following  A little help with walking and/or transfers;A little help with bathing/dressing/bathroom;Assistance with cooking/housework;Assist for transportation;Help with stairs or ramp for entrance    Equipment Recommendations None recommended by PT  Recommendations for Other Services       Functional Status Assessment Patient has had a recent decline in their functional status and demonstrates the ability to make significant improvements in function in a reasonable and predictable amount of time.     Precautions / Restrictions Precautions Precautions: Fall;Knee Restrictions Weight Bearing Restrictions: No Other Position/Activity Restrictions: wbat      Mobility  Bed Mobility Overal bed mobility: Needs Assistance Bed Mobility: Supine to Sit     Supine to sit: Supervision, HOB elevated     General bed mobility comments: Supv for safety, lines    Transfers Overall transfer level: Needs assistance Equipment used:  Rolling walker (2 wheels) Transfers: Sit to/from Stand Sit to Stand: Min guard           General transfer comment: Min guard for safety. Cues for safety, hand/LE placement. Pt reported some lightheadedness    Ambulation/Gait Ambulation/Gait assistance: Min guard Gait Distance (Feet): 15 Feet Assistive device: Rolling walker (2 wheels) Gait Pattern/deviations: Step-to pattern       General Gait Details: Min guard for safety. Cues for safety, sequence. Pt tolerated activity well. Lightheadedness did not worsen or improve.  Stairs            Wheelchair Mobility    Modified Rankin (Stroke Patients Only)       Balance Overall balance assessment: Needs assistance         Standing balance support: Bilateral upper extremity supported, Reliant on assistive device for balance, During functional activity Standing balance-Leahy Scale: Fair                               Pertinent Vitals/Pain Pain Assessment Pain Assessment: 0-10 Pain Score: 7  Pain Location: R knee Pain Descriptors / Indicators: Discomfort, Sore Pain Intervention(s): Limited activity within patient's tolerance, Monitored during session, Ice applied, Repositioned    Home Living Family/patient expects to be discharged to:: Private residence Living Arrangements: Spouse/significant other Available Help at Discharge: Family Type of Home: House Home Access: Stairs to enter Entrance Stairs-Rails: Right;Left;Can reach both Entrance Stairs-Number of Steps: 5   Home Layout: One level Home Equipment: Conservation officer, nature (2 wheels);Cane - single point;Crutches;Grab bars - tub/shower      Prior Function Prior Level of Function : Independent/Modified Independent  Hand Dominance        Extremity/Trunk Assessment   Upper Extremity Assessment Upper Extremity Assessment: Overall WFL for tasks assessed    Lower Extremity Assessment Lower Extremity Assessment:  Generalized weakness    Cervical / Trunk Assessment Cervical / Trunk Assessment: Normal  Communication   Communication: No difficulties  Cognition Arousal/Alertness: Awake/alert Behavior During Therapy: WFL for tasks assessed/performed Overall Cognitive Status: Within Functional Limits for tasks assessed                                          General Comments      Exercises     Assessment/Plan    PT Assessment Patient needs continued PT services  PT Problem List Decreased strength;Decreased range of motion;Decreased activity tolerance;Decreased balance;Decreased mobility;Decreased knowledge of use of DME;Pain       PT Treatment Interventions DME instruction;Gait training;Therapeutic exercise;Balance training;Stair training;Functional mobility training;Therapeutic activities;Patient/family education    PT Goals (Current goals can be found in the Care Plan section)  Acute Rehab PT Goals Patient Stated Goal: regain PLOF/independence PT Goal Formulation: With patient Time For Goal Achievement: 11/30/22 Potential to Achieve Goals: Good    Frequency 7X/week     Co-evaluation               AM-PAC PT "6 Clicks" Mobility  Outcome Measure Help needed turning from your back to your side while in a flat bed without using bedrails?: A Little Help needed moving from lying on your back to sitting on the side of a flat bed without using bedrails?: A Little Help needed moving to and from a bed to a chair (including a wheelchair)?: A Little Help needed standing up from a chair using your arms (e.g., wheelchair or bedside chair)?: A Little Help needed to walk in hospital room?: A Little Help needed climbing 3-5 steps with a railing? : A Little 6 Click Score: 18    End of Session Equipment Utilized During Treatment: Gait belt Activity Tolerance: Patient tolerated treatment well Patient left: in chair;with call bell/phone within reach   PT Visit Diagnosis:  Pain;Other abnormalities of gait and mobility (R26.89) Pain - Right/Left: Right Pain - part of body: Knee    Time: [id number] PT Time Calculation (min) (ACUTE ONLY): 31 min   Charges:   PT Evaluation $PT Eval Low Complexity: 1 Low PT Treatments $Gait Training: 8-22 mins         Doreatha Massed, PT Acute Rehabilitation  Office: [phone]

## 2022-11-16 NOTE — Plan of Care (Signed)
  Problem: Activity: Goal: Ability to avoid complications of mobility impairment will improve Outcome: Progressing Goal: Range of joint motion will improve Outcome: Progressing   Problem: Pain Management: Goal: Pain level will decrease with appropriate interventions Outcome: Progressing   Problem: Safety: Goal: Ability to remain free from injury will improve Outcome: Progressing   

## 2022-11-16 NOTE — Op Note (Signed)
NAME:  Craig [name]                      MEDICAL RECORD NO.:  [id number]                             FACILITY:  [facility]      PHYSICIAN:  Pietro Cassis. Alvan Dame, M.D.  DATE OF BIRTH:  [birth year]      DATE OF PROCEDURE:  11/16/2022                                     OPERATIVE REPORT         PREOPERATIVE DIAGNOSIS:  Right knee osteoarthritis.      POSTOPERATIVE DIAGNOSIS:  Right knee osteoarthritis.      FINDINGS:  The patient was noted to have complete loss of cartilage and   bone-on-bone arthritis with associated osteophytes in the medial and patellofemoral compartments of   the knee with a significant synovitis and associated effusion.  The patient had failed months of conservative treatment including medications, injection therapy, activity modification.     PROCEDURE:  Right total knee replacement.      COMPONENTS USED:  DePuy Attune CR MS knee   system, a size 5 femur, 6 tibia, size 6 mm CR MS AOX insert, and 35 anatomic patellar   button.      SURGEON:  Pietro Cassis. Alvan Dame, M.D.      ASSISTANT:  Craig Hatcher, PA-C.      ANESTHESIA:  Regional and Spinal.      SPECIMENS:  None.      COMPLICATION:  None.      DRAINS:  None.  EBL: <200 cc      TOURNIQUET TIME:   Total Tourniquet Time Documented: Thigh (Right) - 42 minutes Total: Thigh (Right) - 42 minutes  .      The patient was stable to the recovery room.      INDICATION FOR PROCEDURE:  [name] is a 72 y.o. male patient of   mine.  The patient had been seen, evaluated, and treated for months conservatively in the   office with medication, activity modification, and injections.  The patient had   radiographic changes of bone-on-bone arthritis with endplate sclerosis and osteophytes noted.  Based on the radiographic changes and failed conservative measures, the patient   decided to proceed with definitive treatment, total knee replacement.  Risks of infection, DVT, component failure, need for revision surgery, neurovascular  injury were reviewed in the office setting.  The postop course was reviewed stressing the efforts to maximize post-operative satisfaction and function.  Consent was obtained for benefit of pain   relief.      PROCEDURE IN DETAIL:  The patient was brought to the operative theater.   Once adequate anesthesia, preoperative antibiotics, 2 gm of Ancef,1 gm of Tranexamic Acid, and 10 mg of Decadron administered, the patient was positioned supine with a right thigh tourniquet placed.  The  right lower extremity was prepped and draped in sterile fashion.  A time-   out was performed identifying the patient, planned procedure, and the appropriate extremity.      The right lower extremity was placed in the [facility] leg holder.  The leg was   exsanguinated, tourniquet elevated to 225 mmHg.  A midline incision was   made  followed by median parapatellar arthrotomy.  Following initial   exposure, attention was first directed to the patella.  Precut   measurement was noted to be 24 mm.  I resected down to 14 mm and used a   35 anatomic patellar button to restore patellar height as well as cover the cut surface.      The lug holes were drilled and a metal shim was placed to protect the   patella from retractors and saw blade during the procedure.      At this point, attention was now directed to the femur.  The femoral   canal was opened with a drill, irrigated to try to prevent fat emboli.  An   intramedullary rod was passed at 5 degrees valgus, 10 mm of bone was   resected off the distal femur due to pre-operative flexion contracture.  Following this resection, the tibia was   subluxated anteriorly.  Using the extramedullary guide, 2 mm of bone was resected off   the proximal medial tibia.  We confirmed the gap would be   stable medially and laterally with a size 5 spacer block as well as confirmed that the tibial cut was perpendicular in the coronal plane, checking with an alignment rod.      Once this  was done, I sized the femur to be a size 5 in the anterior-   posterior dimension, chose a standard component based on medial and   lateral dimension.  The size 5 rotation block was then pinned in   position anterior referenced using the C-clamp to set rotation.  The   anterior, posterior, and  chamfer cuts were made without difficulty nor   notching making certain that I was along the anterior cortex to help   with flexion gap stability.      The final shim cut was made off the lateral aspect of distal femur.      At this point, the tibia was sized to be a size 6.  The size 6 tray was   then pinned in position based on floating a trial tibia componet,   drilled, and keel punched.  Trial reduction was now carried with a 5 femur,  6 tibia, a size 6 mm CR MS insert, and the 35 anatomic patella botton.  The knee was brought to full extension with good flexion stability with the patella   tracking through the trochlea without application of pressure.  Given   all these findings the trial components removed.  Final components were   opened and cement was mixed.  The knee was irrigated with normal saline solution and pulse lavage.  The synovial lining was   then injected with 30 cc of 0.25% Marcaine with epinephrine, 1 cc of Toradol and 30 cc of NS for a total of 61 cc.     Final implants were then cemented onto cleaned and dried cut surfaces of bone with the knee brought to extension with a size 6 mm CR MS trial insert.      Once the cement had fully cured, excess cement was removed   throughout the knee.  I confirmed that I was satisfied with the range of   motion and stability, and the final size 6 mm CR MS AOX insert was chosen.  It was   placed into the knee.      The tourniquet had been let down at 42 minutes.  No significant   hemostasis was required.  The extensor  mechanism was then reapproximated using #1 Vicryl and #1 Stratafix sutures with the knee   in flexion.  The   remaining  wound was closed with 2-0 Vicryl and running 4-0 Monocryl.   The knee was cleaned, dried, dressed sterilely using Dermabond and   Aquacel dressing.  The patient was then   brought to recovery room in stable condition, tolerating the procedure   well.   Please note that Physician Assistant, Craig Hatcher, PA-C was present for the entirety of the case, and was utilized for pre-operative positioning, peri-operative retractor management, general facilitation of the procedure and for primary wound closure at the end of the case.              Pietro Cassis Alvan Dame, M.D.    11/16/2022 9:02 AM

## 2022-11-16 NOTE — Plan of Care (Signed)
  Problem: Education: Goal: Knowledge of the prescribed therapeutic regimen will improve Outcome: Progressing   Problem: Activity: Goal: Range of joint motion will improve Outcome: Progressing   Problem: Pain Managment: Goal: General experience of comfort will improve Outcome: Progressing   Problem: Safety: Goal: Ability to remain free from injury will improve Outcome: Progressing   

## 2022-11-16 NOTE — H&P (Signed)
TOTAL KNEE ADMISSION H&P  Patient is being admitted for right total knee arthroplasty.  Subjective:  Chief Complaint:right knee pain.  HPI: Craig [name], 72 y.o. male, has a history of pain and functional disability in the right knee due to arthritis and has failed non-surgical conservative treatments for greater than 12 weeks to includeNSAID's and/or analgesics and activity modification.  Onset of symptoms was gradual, starting 2 years ago with gradually worsening course since that time. The patient noted no past surgery on the right knee(s).  Patient currently rates pain in the right knee(s) at 8 out of 10 with activity. Patient has worsening of pain with activity and weight bearing, pain that interferes with activities of daily living, and pain with passive range of motion.  Patient has evidence of joint space narrowing by imaging studies.  There is no active infection.  Patient Active Problem List   Diagnosis Date Noted   S/P total knee arthroplasty, left 06/25/2021   Atypical chest pain 07/21/2017   Encounter for pre-operative cardiovascular clearance 07/21/2017   History of DVT (deep vein thrombosis) 12/18/2014   Edema of right lower extremity 12/18/2014   Obesity (BMI 30-39.9) 06/24/2013   History of pulmonary embolus (PE) 01/23/2013   DVT (deep venous thrombosis) ([place]) 01/23/2013   Essential hypertension    Past Medical History:  Diagnosis Date   Complication of anesthesia    shortness of breath with colonoscopy but not with most recent one   DVT (deep venous thrombosis) ([facility])    Hypertension    Hypogonadism male    OA (osteoarthritis) of hip    s/p R THR   Osteoarthritis of both knees    Peripheral vascular disease ([place])    Pre-diabetes     Past Surgical History:  Procedure Laterality Date   CARDIAC CATHETERIZATION  2006   no blockage   CARDIAC CATHETERIZATION  10/30/2008   CARPAL TUNNEL RELEASE  2009   Left   HEMORRHOIDECTOMY WITH HEMORRHOID BANDING     KNEE  ARTHROSCOPY     NECK SURGERY     TOTAL HIP ARTHROPLASTY Right 07/2017   Dr Alvan Dame, at [place] Left 06/25/2021   Procedure: TOTAL KNEE ARTHROPLASTY;  Surgeon: Paralee Cancel, MD;  Location: [facility];  Service: Orthopedics;  Laterality: Left;   Venous Doppler, lower extremity Right 05/18/2013   Chronic appearing DVT with some recanalization involving the femoral vein, posterior tibial and peroneal veins. There does appear to be partial resolution of the common femoral vein and popliteal pain thrombus with residual findings distally.    Current Facility-Administered Medications  Medication Dose Route Frequency Provider Last Rate Last Admin   ceFAZolin (ANCEF) IVPB 2g/100 mL premix  2 g Intravenous On Call to OR Irving Copas, PA-C       dexamethasone (DECADRON) injection 8 mg  8 mg Intravenous Once Irving Copas, PA-C       lactated ringers infusion   Intravenous Continuous Irving Copas, PA-C       lactated ringers infusion   Intravenous Continuous Nilda Simmer, MD 10 mL/hr at 11/16/22 0549 New Bag at 11/16/22 0549   povidone-iodine 10 % swab 2 Application  2 Application Topical Once Irving Copas, PA-C       tranexamic acid (CYKLOKAPRON) IVPB 1,000 mg  1,000 mg Intravenous To OR Irving Copas, PA-C       Allergies  Allergen Reactions   Penicillins Rash    Has patient had  a PCN reaction causing immediate rash, facial/tongue/throat swelling, SOB or lightheadedness with hypotension: NoNo Has patient had a PCN reaction causing severe rash involving mucus membranes or skin necrosis: NoNo Has patient had a PCN reaction that required hospitalization No No Has patient had a PCN reaction occurring within the last 10 years: NoNo If all of the above answers are "NO", then may proceed with Cephalosporin  Tolerated Cephalosporin Date: 06/25/21.      Social History   Tobacco Use   Smoking status: Never   Smokeless tobacco: Never   Substance Use Topics   Alcohol use: No    Family History  Problem Relation Age of Onset   Cancer Mother    Diabetes Father    Stroke Maternal Grandmother    Diabetes Paternal Grandmother    Diabetes Sister      Review of Systems  Constitutional:  Negative for chills and fever.  Respiratory:  Negative for cough and shortness of breath.   Cardiovascular:  Negative for chest pain.  Gastrointestinal:  Negative for nausea and vomiting.  Musculoskeletal:  Positive for arthralgias.     Objective:  Physical Exam Well nourished and well developed. General: Alert and oriented x3, cooperative and pleasant, no acute distress. Head: normocephalic, atraumatic, neck supple. Eyes: EOMI.  Musculoskeletal: Left knee exam: His surgical incision remains well-healed without signs of infection No palpable effusion Slight flexion contracture with flexion over 100 degrees  Right knee exam: No palpable effusion today Genu varum associated with tenderness medially Flexion contracture of about 5 degrees No significant lower extremity edema reported, no erythema  Calves soft and nontender. Motor function intact in LE. Strength 5/5 LE bilaterally. Neuro: Distal pulses 2+. Sensation to light touch intact in LE.   Vital signs in last 24 hours: Temp:  [98.4 F (36.9 C)] 98.4 F (36.9 C) (02/20 0553) Pulse Rate:  [52] 52 (02/20 0553) Resp:  [17] 17 (02/20 0553) BP: (152)/(93) 152/93 (02/20 0553) SpO2:  [96 %] 96 % (02/20 0553) Weight:  ST:[id number] kg] 107 kg (02/20 0542)  Labs:   Estimated body mass index is 40.51 kg/m as calculated from the following:   Height as of this encounter: 5' 4"$  (1.626 m).   Weight as of this encounter: 107 kg.   Imaging Review Plain radiographs demonstrate severe degenerative joint disease of the right knee(s). The overall alignment isneutral. The bone quality appears to be adequate for age and reported activity level.      Assessment/Plan:  End stage  arthritis, right knee   The patient history, physical examination, clinical judgment of the provider and imaging studies are consistent with end stage degenerative joint disease of the right knee(s) and total knee arthroplasty is deemed medically necessary. The treatment options including medical management, injection therapy arthroscopy and arthroplasty were discussed at length. The risks and benefits of total knee arthroplasty were presented and reviewed. The risks due to aseptic loosening, infection, stiffness, patella tracking problems, thromboembolic complications and other imponderables were discussed. The patient acknowledged the explanation, agreed to proceed with the plan and consent was signed. Patient is being admitted for inpatient treatment for surgery, pain control, PT, OT, prophylactic antibiotics, VTE prophylaxis, progressive ambulation and ADL's and discharge planning. The patient is planning to be discharged  home.  Therapy Plans: outpatient therapy at [facility] Disposition: Home with wife Planned DVT Prophylaxis: Xarelto 34m daily DME needed: none PCP: Dr. PDelfina Redwood clearance received Cardio: Dr. HEllyn Hack- Appointment on Monday TXA: IV Allergies: PCN - genital rash Anesthesia  Concerns: none BMI: 39.4 Last HgbA1c: pre-diabetic   Other: - Extended PT with last leg - slow progression with ROM - Hx of DVT in the right leg -- will use xarelto - ice machine at the hospital - oxycodone, robaxin, tylenol    Patient's anticipated LOS is less than 2 midnights, meeting these requirements: - Younger than 62 - Lives within 1 hour of care - Has a competent adult at home to recover with post-op recover - NO history of  - Chronic pain requiring opiods  - Diabetes  - Coronary Artery Disease  - Heart failure  - Heart attack  - Stroke  - DVT/VTE  - Cardiac arrhythmia  - Respiratory Failure/COPD  - Renal failure  - Anemia  - Advanced Liver disease  Costella Hatcher, PA-C Orthopedic  Surgery [facility] [place] [phone]

## 2022-11-16 NOTE — Anesthesia Procedure Notes (Signed)
Anesthesia Regional Block: Adductor canal block   Pre-Anesthetic Checklist: , timeout performed,  Correct Patient, Correct Site, Correct Laterality,  Correct Procedure, Correct Position, site marked,  Risks and benefits discussed,  Pre-op evaluation,  At surgeon's request and post-op pain management  Laterality: Right  Prep: Maximum Sterile Barrier Precautions used, chloraprep       Needles:  Injection technique: Single-shot  Needle Type: Echogenic Stimulator Needle     Needle Length: 9cm  Needle Gauge: 22     Additional Needles:   Procedures:,,,, ultrasound used (permanent image in chart),,    Narrative:  Start time: 11/16/2022 6:51 AM End time: 11/16/2022 6:54 AM Injection made incrementally with aspirations every 5 mL.  Performed by: Personally  Anesthesiologist: Brennan Bailey, MD  Additional Notes: Risks, benefits, and alternative discussed. Patient gave consent for procedure. Patient prepped and draped in sterile fashion. Sedation administered, patient remains easily responsive to voice. Relevant anatomy identified with ultrasound guidance. Local anesthetic given in 5cc increments with no signs or symptoms of intravascular injection. No pain or paraesthesias with injection. Patient monitored throughout procedure with signs of LAST or immediate complications. Tolerated well. Ultrasound image placed in chart.  Tawny Asal, MD

## 2022-11-16 NOTE — Discharge Instructions (Addendum)
Information on my medicine - XARELTO (Rivaroxaban)  This medication education was reviewed with me or my healthcare representative as part of my discharge preparation.   Why was Xarelto prescribed for you? Xarelto was prescribed for you to reduce the risk of blood clots forming after orthopedic surgery. The medical term for these abnormal blood clots is venous thromboembolism (VTE).  What do you need to know about xarelto ? Take your Xarelto ONCE DAILY at the same time every day. You may take it either with or without food.  If you have difficulty swallowing the tablet whole, you may crush it and mix in applesauce just prior to taking your dose.  Take Xarelto exactly as prescribed by your doctor and DO NOT stop taking Xarelto without talking to the doctor who prescribed the medication.  Stopping without other VTE prevention medication to take the place of Xarelto may increase your risk of developing a clot.  After discharge, you should have regular check-up appointments with your healthcare provider that is prescribing your Xarelto.    What do you do if you miss a dose? If you miss a dose, take it as soon as you remember on the same day then continue your regularly scheduled once daily regimen the next day. Do not take two doses of Xarelto on the same day.   Important Safety Information A possible side effect of Xarelto is bleeding. You should call your healthcare provider right away if you experience any of the following: Bleeding from an injury or your nose that does not stop. Unusual colored urine (red or dark brown) or unusual colored stools (red or black). Unusual bruising for unknown reasons. A serious fall or if you hit your head (even if there is no bleeding).  Some medicines may interact with Xarelto and might increase your risk of bleeding while on Xarelto. To help avoid this, consult your healthcare provider or pharmacist prior to using any new prescription or  non-prescription medications, including herbals, vitamins, non-steroidal anti-inflammatory drugs (NSAIDs) and supplements.  This website has more information on Xarelto: [email or web address]://guerra-benson.com/. INSTRUCTIONS AFTER JOINT REPLACEMENT   Remove items at home which could result in a fall. This includes throw rugs or furniture in walking pathways ICE to the affected joint every three hours while awake for 30 minutes at a time, for at least the first 3-5 days, and then as needed for pain and swelling.  Continue to use ice for pain and swelling. You may notice swelling that will progress down to the foot and ankle.  This is normal after surgery.  Elevate your leg when you are not up walking on it.   Continue to use the breathing machine you got in the hospital (incentive spirometer) which will help keep your temperature down.  It is common for your temperature to cycle up and down following surgery, especially at night when you are not up moving around and exerting yourself.  The breathing machine keeps your lungs expanded and your temperature down.   DIET:  As you were doing prior to hospitalization, we recommend a well-balanced diet.  DRESSING / WOUND CARE / SHOWERING  Keep the surgical dressing until follow up.  The dressing is water proof, so you can shower without any extra covering.  IF THE DRESSING FALLS OFF or the wound gets wet inside, change the dressing with sterile gauze.  Please use good hand washing techniques before changing the dressing.  Do not use any lotions or creams on the incision until instructed by  your surgeon.    ACTIVITY  Increase activity slowly as tolerated, but follow the weight bearing instructions below.   No driving for 6 weeks or until further direction given by your physician.  You cannot drive while taking narcotics.  No lifting or carrying greater than 10 lbs. until further directed by your surgeon. Avoid periods of inactivity such as sitting longer than an hour when not  asleep. This helps prevent blood clots.  You may return to work once you are authorized by your doctor.     WEIGHT BEARING   Weight bearing as tolerated with assist device (walker, cane, etc) as directed, use it as long as suggested by your surgeon or therapist, typically at least 4-6 weeks.   EXERCISES  Results after joint replacement surgery are often greatly improved when you follow the exercise, range of motion and muscle strengthening exercises prescribed by your doctor. Safety measures are also important to protect the joint from further injury. Any time any of these exercises cause you to have increased pain or swelling, decrease what you are doing until you are comfortable again and then slowly increase them. If you have problems or questions, call your caregiver or physical therapist for advice.   Rehabilitation is important following a joint replacement. After just a few days of immobilization, the muscles of the leg can become weakened and shrink (atrophy).  These exercises are designed to build up the tone and strength of the thigh and leg muscles and to improve motion. Often times heat used for twenty to thirty minutes before working out will loosen up your tissues and help with improving the range of motion but do not use heat for the first two weeks following surgery (sometimes heat can increase post-operative swelling).   These exercises can be done on a training (exercise) mat, on the floor, on a table or on a bed. Use whatever works the best and is most comfortable for you.    Use music or television while you are exercising so that the exercises are a pleasant break in your day. This will make your life better with the exercises acting as a break in your routine that you can look forward to.   Perform all exercises about fifteen times, three times per day or as directed.  You should exercise both the operative leg and the other leg as well.  Exercises include:   Quad Sets -  Tighten up the muscle on the front of the thigh (Quad) and hold for 5-10 seconds.   Straight Leg Raises - With your knee straight (if you were given a brace, keep it on), lift the leg to 60 degrees, hold for 3 seconds, and slowly lower the leg.  Perform this exercise against resistance later as your leg gets stronger.  Leg Slides: Lying on your back, slowly slide your foot toward your buttocks, bending your knee up off the floor (only go as far as is comfortable). Then slowly slide your foot back down until your leg is flat on the floor again.  Angel Wings: Lying on your back spread your legs to the side as far apart as you can without causing discomfort.  Hamstring Strength:  Lying on your back, push your heel against the floor with your leg straight by tightening up the muscles of your buttocks.  Repeat, but this time bend your knee to a comfortable angle, and push your heel against the floor.  You may put a pillow under the heel to make  it more comfortable if necessary.   A rehabilitation program following joint replacement surgery can speed recovery and prevent re-injury in the future due to weakened muscles. Contact your doctor or a physical therapist for more information on knee rehabilitation.    CONSTIPATION  Constipation is defined medically as fewer than three stools per week and severe constipation as less than one stool per week.  Even if you have a regular bowel pattern at home, your normal regimen is likely to be disrupted due to multiple reasons following surgery.  Combination of anesthesia, postoperative narcotics, change in appetite and fluid intake all can affect your bowels.   YOU MUST use at least one of the following options; they are listed in order of increasing strength to get the job done.  They are all available over the counter, and you may need to use some, POSSIBLY even all of these options:    Drink plenty of fluids (prune juice may be helpful) and high fiber  foods Colace 100 mg by mouth twice a day  Senokot for constipation as directed and as needed Dulcolax (bisacodyl), take with full glass of water  Miralax (polyethylene glycol) once or twice a day as needed.  If you have tried all these things and are unable to have a bowel movement in the first 3-4 days after surgery call either your surgeon or your primary doctor.    If you experience loose stools or diarrhea, hold the medications until you stool forms back up.  If your symptoms do not get better within 1 week or if they get worse, check with your doctor.  If you experience "the worst abdominal pain ever" or develop nausea or vomiting, please contact the office immediately for further recommendations for treatment.   ITCHING:  If you experience itching with your medications, try taking only a single pain pill, or even half a pain pill at a time.  You can also use Benadryl over the counter for itching or also to help with sleep.   TED HOSE STOCKINGS:  Use stockings on both legs until for at least 2 weeks or as directed by physician office. They may be removed at night for sleeping.  MEDICATIONS:  See your medication summary on the "After Visit Summary" that nursing will review with you.  You may have some home medications which will be placed on hold until you complete the course of blood thinner medication.  It is important for you to complete the blood thinner medication as prescribed.  PRECAUTIONS:  If you experience chest pain or shortness of breath - call 911 immediately for transfer to the hospital emergency department.   If you develop a fever greater that 101 F, purulent drainage from wound, increased redness or drainage from wound, foul odor from the wound/dressing, or calf pain - CONTACT YOUR SURGEON.                                                   FOLLOW-UP APPOINTMENTS:  If you do not already have a post-op appointment, please call the office for an appointment to be seen by your  surgeon.  Guidelines for how soon to be seen are listed in your "After Visit Summary", but are typically between 1-4 weeks after surgery.  OTHER INSTRUCTIONS:   Knee Replacement:  Do not place pillow under knee, focus  on keeping the knee straight while resting. CPM instructions: 0-90 degrees, 2 hours in the morning, 2 hours in the afternoon, and 2 hours in the evening. Place foam block, curve side up under heel at all times except when in CPM or when walking.  DO NOT modify, tear, cut, or change the foam block in any way.  POST-OPERATIVE OPIOID TAPER INSTRUCTIONS: It is important to wean off of your opioid medication as soon as possible. If you do not need pain medication after your surgery it is ok to stop day one. Opioids include: Codeine, Hydrocodone(Norco, Vicodin), Oxycodone(Percocet, oxycontin) and hydromorphone amongst others.  Long term and even short term use of opiods can cause: Increased pain response Dependence Constipation Depression Respiratory depression And more.  Withdrawal symptoms can include Flu like symptoms Nausea, vomiting And more Techniques to manage these symptoms Hydrate well Eat regular healthy meals Stay active Use relaxation techniques(deep breathing, meditating, yoga) Do Not substitute Alcohol to help with tapering If you have been on opioids for less than two weeks and do not have pain than it is ok to stop all together.  Plan to wean off of opioids This plan should start within one week post op of your joint replacement. Maintain the same interval or time between taking each dose and first decrease the dose.  Cut the total daily intake of opioids by one tablet each day Next start to increase the time between doses. The last dose that should be eliminated is the evening dose.   MAKE SURE YOU:  Understand these instructions.  Get help right away if you are not doing well or get worse.    Thank you for letting us be a part of your medical care  team.  It is a privilege we respect greatly.  We hope these instructions will help you stay on track for a fast and full recovery!

## 2022-11-16 NOTE — Anesthesia Procedure Notes (Signed)
Spinal  Patient location during procedure: OR Start time: 11/16/2022 7:19 AM End time: 11/16/2022 7:22 AM Reason for block: surgical anesthesia Staffing Performed: anesthesiologist  Anesthesiologist: Brennan Bailey, MD Performed by: Brennan Bailey, MD Authorized by: Brennan Bailey, MD   Preanesthetic Checklist Completed: patient identified, IV checked, risks and benefits discussed, surgical consent, monitors and equipment checked, pre-op evaluation and timeout performed Spinal Block Patient position: sitting Prep: DuraPrep and site prepped and draped Patient monitoring: continuous pulse ox, blood pressure and heart rate Approach: midline Location: L3-4 Injection technique: single-shot Needle Needle type: Pencan  Needle gauge: 24 G Needle length: 9 cm Assessment Events: CSF return Additional Notes Risks, benefits, and alternative discussed. Patient gave consent to procedure. Prepped and draped in sitting position. Patient sedated but responsive to voice. Clear CSF obtained after one needle pass. Positive terminal aspiration. No pain or paraesthesias with injection. Patient tolerated procedure well. Vital signs stable. Tawny Asal, MD

## 2022-11-16 NOTE — Interval H&P Note (Signed)
History and Physical Interval Note:  11/16/2022 7:07 AM  Craig [name]  has presented today for surgery, with the diagnosis of Right knee osteoarthritis.  The various methods of treatment have been discussed with the patient and family. After consideration of risks, benefits and other options for treatment, the patient has consented to  Procedure(s): TOTAL KNEE ARTHROPLASTY (Right) as a surgical intervention.  The patient's history has been reviewed, patient examined, no change in status, stable for surgery.  I have reviewed the patient's chart and labs.  Questions were answered to the patient's satisfaction.     Mauri Pole

## 2022-11-16 NOTE — OR Nursing (Signed)
Foley attempted and unsuccessful. Costella Hatcher PA present and ok to proceed without foley.

## 2022-11-16 NOTE — Transfer of Care (Signed)
Immediate Anesthesia Transfer of Care Note  Patient: Craig [name]  Procedure(s) Performed: TOTAL KNEE ARTHROPLASTY (Right: Knee)  Patient Location: PACU  Anesthesia Type:Spinal  Level of Consciousness: awake and alert   Airway & Oxygen Therapy: Patient Spontanous Breathing and Patient connected to face mask oxygen  Post-op Assessment: Report given to RN and Post -op Vital signs reviewed and stable  Post vital signs: Reviewed and stable  Last Vitals:  Vitals Value Taken Time  BP 111/64 11/16/22 0924  Temp    Pulse 73 11/16/22 0925  Resp 15 11/16/22 0925  SpO2 100 % 11/16/22 0925  Vitals shown include unvalidated device data.  Last Pain:  Vitals:   11/16/22 0553  TempSrc: Oral  PainSc:       Patients Stated Pain Goal: 4 ([id number])  Complications: No notable events documented.

## 2022-11-17 ENCOUNTER — Encounter (HOSPITAL_COMMUNITY): Payer: Self-pay | Admitting: Orthopedic Surgery

## 2022-11-17 DIAGNOSIS — Z86718 Personal history of other venous thrombosis and embolism: Secondary | ICD-10-CM | POA: Diagnosis not present

## 2022-11-17 DIAGNOSIS — Z96652 Presence of left artificial knee joint: Secondary | ICD-10-CM | POA: Diagnosis not present

## 2022-11-17 DIAGNOSIS — Z79899 Other long term (current) drug therapy: Secondary | ICD-10-CM | POA: Diagnosis not present

## 2022-11-17 DIAGNOSIS — Z96641 Presence of right artificial hip joint: Secondary | ICD-10-CM | POA: Diagnosis not present

## 2022-11-17 DIAGNOSIS — I10 Essential (primary) hypertension: Secondary | ICD-10-CM | POA: Diagnosis not present

## 2022-11-17 DIAGNOSIS — M1711 Unilateral primary osteoarthritis, right knee: Secondary | ICD-10-CM | POA: Diagnosis not present

## 2022-11-17 LAB — CBC
HCT: 40.1 % (ref 39.0–52.0)
Hemoglobin: 13.2 g/dL (ref 13.0–17.0)
MCH: 29.6 pg (ref 26.0–34.0)
MCHC: 32.9 g/dL (ref 30.0–36.0)
MCV: 89.9 fL (ref 80.0–100.0)
Platelets: 208 10*3/uL (ref 150–400)
RBC: 4.46 MIL/uL (ref 4.22–5.81)
RDW: 13 % (ref 11.5–15.5)
WBC: 8.5 10*3/uL (ref 4.0–10.5)
nRBC: 0 % (ref 0.0–0.2)

## 2022-11-17 LAB — BASIC METABOLIC PANEL WITH GFR
Anion gap: 8 (ref 5–15)
BUN: 14 mg/dL (ref 8–23)
CO2: 24 mmol/L (ref 22–32)
Calcium: 8.8 mg/dL — ABNORMAL LOW (ref 8.9–10.3)
Chloride: 101 mmol/L (ref 98–111)
Creatinine, Ser: 1.19 mg/dL (ref 0.61–1.24)
GFR, Estimated: >60 mL/min
Glucose, Bld: 126 mg/dL — ABNORMAL HIGH (ref 70–99)
Potassium: 4.3 mmol/L (ref 3.5–5.1)
Sodium: 133 mmol/L — ABNORMAL LOW (ref 135–145)

## 2022-11-17 MED ORDER — POLYETHYLENE GLYCOL 3350 17 G PO PACK: 17.0000 g | PACK | Freq: Two times a day (BID) | ORAL | 0 refills | Status: DC

## 2022-11-17 MED ORDER — METHOCARBAMOL 500 MG PO TABS: 500.0000 mg | ORAL_TABLET | Freq: Four times a day (QID) | ORAL | 2 refills | Status: DC | PRN

## 2022-11-17 MED ORDER — OXYCODONE HCL 5 MG PO TABS: 5.0000 mg | ORAL_TABLET | ORAL | 0 refills | Status: DC | PRN

## 2022-11-17 MED ORDER — RIVAROXABAN 10 MG PO TABS: 10.0000 mg | ORAL_TABLET | Freq: Every day | ORAL | 0 refills | Status: DC

## 2022-11-17 MED ORDER — SENNA 8.6 MG PO TABS: 1.0000 | ORAL_TABLET | Freq: Every day | ORAL | 0 refills | Status: AC

## 2022-11-17 NOTE — Progress Notes (Signed)
   Subjective: 1 Day Post-Op Procedure(s) (LRB): TOTAL KNEE ARTHROPLASTY (Right) Patient reports pain as mild.   Patient seen in rounds with Dr. Alvan Dame. Patient is well, and has had no acute complaints or problems. No acute events overnight. Foley catheter removed. Patient ambulated 15 feet with PT.  We will start therapy today.   Objective: Vital signs in last 24 hours: Temp:  [97.4 F (36.3 C)-98 F (36.7 C)] 97.8 F (36.6 C) (02/21 0509) Pulse Rate:  [43-64] 53 (02/21 0509) Resp:  [13-23] 17 (02/21 0509) BP: (106-130)/(62-83) 117/82 (02/21 0509) SpO2:  [92 %-100 %] 100 % (02/21 0509)  Intake/Output from previous day:  Intake/Output Summary (Last 24 hours) at 11/17/2022 0850 Last data filed at 11/17/2022 0600 Gross per 24 hour  Intake 3274.78 ml  Output 1400 ml  Net 1874.78 ml     Intake/Output this shift: No intake/output data recorded.  Labs: Recent Labs    11/17/22 0324  HGB 13.2   Recent Labs    11/17/22 0324  WBC 8.5  RBC 4.46  HCT 40.1  PLT 208   Recent Labs    11/17/22 0324  NA 133*  K 4.3  CL 101  CO2 24  BUN 14  CREATININE 1.19  GLUCOSE 126*  CALCIUM 8.8*   No results for input(s): "LABPT", "INR" in the last 72 hours.  Exam: General - Patient is Alert and Oriented Extremity - Neurologically intact Sensation intact distally Intact pulses distally Dorsiflexion/Plantar flexion intact Dressing - dressing C/D/I Motor Function - intact, moving foot and toes well on exam.   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   Weight as of this encounter: 107 kg. Advance diet Up with therapy D/C IV fluids   Patient's anticipated LOS is less than 2 midnights, meeting these requirements: - Younger than 51 - Lives within 1 hour of care - Has a competent adult at home to recover with post-op recover - NO history of  - Chronic pain requiring opiods  - Diabetes  - Coronary Artery Disease  - Heart failure  - Heart attack  - Stroke  - DVT/VTE  - Cardiac arrhythmia  - Respiratory Failure/COPD  - Renal failure  - Anemia  - Advanced Liver disease     DVT Prophylaxis - Xarelto - due to hx of DVT Weight bearing as tolerated.  Hgb stable at 13.2 this AM  Plan is to go Home after hospital stay. Plan for discharge today following 1-2 sessions of PT as long as they are meeting their goals. Patient is scheduled for OPPT. Follow up in the office in 2 weeks.   Griffith Citron, PA-C Orthopedic Surgery [phone] 11/17/2022, 8:50 AM

## 2022-11-17 NOTE — Progress Notes (Signed)
Physical Therapy Treatment Patient Details Name: Craig [name] MRN: [id number] DOB: 02-Nov-1950 Today's Date: 11/17/2022   History of Present Illness 72 yo male s/p R TKA 11/16/22. Hx of L TKA 2022, R THA 2018, PE/DVT (per pt report)    PT Comments    Pt is progressing with mobility. Moderate pain with activity. Plan is for for d/c home after 2nd session-will practice stair negotiation.    Recommendations for follow up therapy are one component of a multi-disciplinary discharge planning process, led by the attending physician.  Recommendations may be updated based on patient status, additional functional criteria and insurance authorization.  Follow Up Recommendations  Follow physician's recommendations for discharge plan and follow up therapies     Assistance Recommended at Discharge Intermittent Supervision/Assistance  Patient can return home with the following A little help with walking and/or transfers;A little help with bathing/dressing/bathroom;Assistance with cooking/housework;Assist for transportation;Help with stairs or ramp for entrance   Equipment Recommendations  None recommended by PT    Recommendations for Other Services       Precautions / Restrictions Precautions Precautions: Fall;Knee Restrictions Weight Bearing Restrictions: No Other Position/Activity Restrictions: wbat     Mobility  Bed Mobility Overal bed mobility: Needs Assistance Bed Mobility: Supine to Sit     Supine to sit: Supervision, HOB elevated     General bed mobility comments: Supv for safety, lines    Transfers Overall transfer level: Needs assistance Equipment used: Rolling walker (2 wheels) Transfers: Sit to/from Stand Sit to Stand: Supervision           General transfer comment: Cues for safety, hand/LE placement. Pt reported some lightheadedness    Ambulation/Gait Ambulation/Gait assistance: Min guard Gait Distance (Feet): 75 Feet Assistive device: Rolling walker (2  wheels) Gait Pattern/deviations: Step-to pattern       General Gait Details: Min guard for safety. Cues for safety, sequence. Pt tolerated activity well.   Stairs             Wheelchair Mobility    Modified Rankin (Stroke Patients Only)       Balance Overall balance assessment: Needs assistance         Standing balance support: Bilateral upper extremity supported, Reliant on assistive device for balance, During functional activity Standing balance-Leahy Scale: Fair                              Cognition Arousal/Alertness: Awake/alert Behavior During Therapy: WFL for tasks assessed/performed Overall Cognitive Status: Within Functional Limits for tasks assessed                                          Exercises Total Joint Exercises Ankle Circles/Pumps: AROM, Both, 10 reps Quad Sets: AROM, Both, 10 reps Hip ABduction/ADduction: AAROM, Right, 10 reps Straight Leg Raises: AAROM, Right, 10 reps Knee Flexion: AROM, Right, 10 reps, Seated Goniometric ROM: ~10-75 degres    General Comments        Pertinent Vitals/Pain Pain Assessment Pain Assessment: 0-10 Pain Score: 5  Pain Location: R knee Pain Descriptors / Indicators: Discomfort, Sore Pain Intervention(s): Monitored during session, Ice applied, Repositioned    Home Living                          Prior Function  PT Goals (current goals can now be found in the care plan section) Progress towards PT goals: Progressing toward goals    Frequency    7X/week      PT Plan Current plan remains appropriate    Co-evaluation              AM-PAC PT "6 Clicks" Mobility   Outcome Measure  Help needed turning from your back to your side while in a flat bed without using bedrails?: A Little Help needed moving from lying on your back to sitting on the side of a flat bed without using bedrails?: A Little Help needed moving to and from a bed to a  chair (including a wheelchair)?: A Little Help needed standing up from a chair using your arms (e.g., wheelchair or bedside chair)?: A Little Help needed to walk in hospital room?: A Little Help needed climbing 3-5 steps with a railing? : A Little 6 Click Score: 18    End of Session Equipment Utilized During Treatment: Gait belt Activity Tolerance: Patient tolerated treatment well Patient left: in chair;with call bell/phone within reach   PT Visit Diagnosis: Pain;Other abnormalities of gait and mobility (R26.89) Pain - Right/Left: Right Pain - part of body: Knee     Time: [id number] PT Time Calculation (min) (ACUTE ONLY): 26 min  Charges:  $Gait Training: 8-22 mins                         Doreatha Massed, PT Acute Rehabilitation  Office: [phone]

## 2022-11-17 NOTE — TOC Transition Note (Signed)
Transition of Care [facility]) - CM/SW Discharge Note   Patient Details  Name: Craig [name] MRN: [id number] Date of Birth: Jan 12, 1951  [facility]) CM/SW Contact:  Lennart Pall, LCSW Phone Number: 11/17/2022, 10:03 AM   Clinical Narrative:     Met briefly with pt and confirming he has needed DME at home.  OPPT already arranged with [facility].  No TOC needs.  Final next level of care: OP Rehab Barriers to Discharge: No Barriers Identified   Patient Goals and CMS Choice      Discharge Placement                         Discharge Plan and Services Additional resources added to the After Visit Summary for                  DME Arranged: N/A DME Agency: NA                  Social Determinants of Health (SDOH) Interventions SDOH Screenings   Food Insecurity: No Food Insecurity (11/16/2022)  Housing: Low Risk  (11/16/2022)  Transportation Needs: No Transportation Needs (11/16/2022)  Utilities: Not At Risk (11/16/2022)  Tobacco Use: Low Risk  (11/16/2022)     Readmission Risk Interventions     No data to display

## 2022-11-17 NOTE — Plan of Care (Signed)
Discharge instructions given to the patient including medications and follow up.  

## 2022-11-17 NOTE — Progress Notes (Signed)
Physical Therapy Treatment Patient Details Name: Craig [name] MRN: [id number] DOB: 1951/07/09 Today's Date: 11/17/2022   History of Present Illness 72 yo male s/p R TKA 11/16/22. Hx of L TKA 2022, R THA 2018, PE/DVT (per pt report)    PT Comments    Progressing with mobility. Pain appears controlled. Encouraged pt to ambulate often as tolerated at home. All PT education completed.   Recommendations for follow up therapy are one component of a multi-disciplinary discharge planning process, led by the attending physician.  Recommendations may be updated based on patient status, additional functional criteria and insurance authorization.  Follow Up Recommendations  Follow physician's recommendations for discharge plan and follow up therapies     Assistance Recommended at Discharge Intermittent Supervision/Assistance  Patient can return home with the following A little help with walking and/or transfers;A little help with bathing/dressing/bathroom;Assistance with cooking/housework;Assist for transportation;Help with stairs or ramp for entrance   Equipment Recommendations  None recommended by PT    Recommendations for Other Services       Precautions / Restrictions Precautions Precautions: Fall;Knee Restrictions Weight Bearing Restrictions: No Other Position/Activity Restrictions: wbat     Mobility  Bed Mobility Overal bed mobility: Needs Assistance Bed Mobility: Supine to Sit          General bed mobility comments: oob in recliner    Transfers Overall transfer level: Needs assistance Equipment used: Rolling walker (2 wheels) Transfers: Sit to/from Stand Sit to Stand: Supervision           General transfer comment: Cues for safety, hand/LE placement.    Ambulation/Gait Ambulation/Gait assistance: Supervision Gait Distance (Feet): 125 Feet Assistive device: Rolling walker (2 wheels) Gait Pattern/deviations: Step-through pattern, Decreased stride length        General Gait Details: Supv for safety. No LOB with RW use. Pt tolerated activity well.   Stairs Stairs: Yes Stairs assistance: Min guard Stair Management: Step to pattern, Forwards, Two rails Number of Stairs: 2 General stair comments: up and over portable stairs x 1. cues for safety, technique, sequence.   Wheelchair Mobility    Modified Rankin (Stroke Patients Only)       Balance Overall balance assessment: Needs assistance         Standing balance support: Bilateral upper extremity supported, Reliant on assistive device for balance, During functional activity Standing balance-Leahy Scale: Fair                              Cognition Arousal/Alertness: Awake/alert Behavior During Therapy: WFL for tasks assessed/performed Overall Cognitive Status: Within Functional Limits for tasks assessed                                          Exercises     General Comments        Pertinent Vitals/Pain Pain Assessment Pain Assessment: 0-10 Pain Score: 5  Pain Location: R knee Pain Descriptors / Indicators: Discomfort, Sore Pain Intervention(s): Monitored during session    Home Living                          Prior Function            PT Goals (current goals can now be found in the care plan section) Progress towards PT goals: Progressing toward goals  Frequency    7X/week      PT Plan Current plan remains appropriate    Co-evaluation              AM-PAC PT "6 Clicks" Mobility   Outcome Measure  Help needed turning from your back to your side while in a flat bed without using bedrails?: A Little Help needed moving from lying on your back to sitting on the side of a flat bed without using bedrails?: A Little Help needed moving to and from a bed to a chair (including a wheelchair)?: A Little Help needed standing up from a chair using your arms (e.g., wheelchair or bedside chair)?: A Little Help needed to  walk in hospital room?: A Little Help needed climbing 3-5 steps with a railing? : A Little 6 Click Score: 18    End of Session Equipment Utilized During Treatment: Gait belt Activity Tolerance: Patient tolerated treatment well Patient left: in chair;with call bell/phone within reach   PT Visit Diagnosis: Pain;Other abnormalities of gait and mobility (R26.89) Pain - Right/Left: Right Pain - part of body: Knee     Time: [id number] PT Time Calculation (min) (ACUTE ONLY): 15 min  Charges:  $Gait Training: 8-22 mins                         Doreatha Massed, PT Acute Rehabilitation  Office: [phone]

## 2022-11-18 DIAGNOSIS — M25661 Stiffness of right knee, not elsewhere classified: Secondary | ICD-10-CM | POA: Diagnosis not present

## 2022-11-18 DIAGNOSIS — M25561 Pain in right knee: Secondary | ICD-10-CM | POA: Diagnosis not present

## 2022-11-22 DIAGNOSIS — M25561 Pain in right knee: Secondary | ICD-10-CM | POA: Diagnosis not present

## 2022-11-22 DIAGNOSIS — M25661 Stiffness of right knee, not elsewhere classified: Secondary | ICD-10-CM | POA: Diagnosis not present

## 2022-11-24 DIAGNOSIS — M25561 Pain in right knee: Secondary | ICD-10-CM | POA: Diagnosis not present

## 2022-11-24 DIAGNOSIS — M25661 Stiffness of right knee, not elsewhere classified: Secondary | ICD-10-CM | POA: Diagnosis not present

## 2022-11-24 NOTE — Discharge Summary (Signed)
Patient ID: ODAS BLASKE MRN: [id number] DOB/AGE: Feb 10, 1951 72 y.o.  Admit date: 11/16/2022 Discharge date: 11/17/2022  Admission Diagnoses:  Right knee osteoarthritis  Discharge Diagnoses:  Principal Problem:   S/P total knee arthroplasty, right   Past Medical History:  Diagnosis Date   Complication of anesthesia    shortness of breath with colonoscopy but not with most recent one   DVT (deep venous thrombosis) (HCC)    Hypertension    Hypogonadism male    OA (osteoarthritis) of hip    s/p R THR   Osteoarthritis of both knees    Peripheral vascular disease ([place])    Pre-diabetes     Surgeries: Procedure(s): TOTAL KNEE ARTHROPLASTY on 11/16/2022   Consultants:   Discharged Condition: Improved  Hospital Course: MARKAIL MICHELS is an 72 y.o. male who was admitted 11/16/2022 for operative treatment ofS/P total knee arthroplasty, right. Patient has severe unremitting pain that affects sleep, daily activities, and work/hobbies. After pre-op clearance the patient was taken to the operating room on 11/16/2022 and underwent  Procedure(s): TOTAL KNEE ARTHROPLASTY.    Patient was given perioperative antibiotics:  Anti-infectives (From admission, onward)    Start     Dose/Rate Route Frequency Ordered Stop   11/16/22 1300  ceFAZolin (ANCEF) IVPB 2g/100 mL premix        2 g 200 mL/hr over 30 Minutes Intravenous Every 6 hours 11/16/22 1139 11/17/22 0948   11/16/22 0600  ceFAZolin (ANCEF) IVPB 2g/100 mL premix        2 g 200 mL/hr over 30 Minutes Intravenous On call to O.R. 11/16/22 [id number] 11/16/22 0729        Patient was given sequential compression devices, early ambulation, and chemoprophylaxis to prevent DVT. Patient worked with PT and was meeting their goals regarding safe ambulation and transfers.  Patient benefited maximally from hospital stay and there were no complications.    Recent vital signs: No data found.   Recent laboratory studies: No results for input(s): "WBC", "HGB",  "HCT", "PLT", "NA", "K", "CL", "CO2", "BUN", "CREATININE", "GLUCOSE", "INR", "CALCIUM" in the last 72 hours.  Invalid input(s): "PT", "2"   Discharge Medications:   Allergies as of 11/17/2022       Reactions   Penicillins Rash   Has patient had a PCN reaction causing immediate rash, facial/tongue/throat swelling, SOB or lightheadedness with hypotension: NoNo Has patient had a PCN reaction causing severe rash involving mucus membranes or skin necrosis: NoNo Has patient had a PCN reaction that required hospitalization No No Has patient had a PCN reaction occurring within the last 10 years: NoNo If all of the above answers are "NO", then may proceed with Cephalosporin Tolerated Cephalosporin Date: 06/25/21.        Medication List     STOP taking these medications    aspirin EC 81 MG tablet   celecoxib 200 MG capsule Commonly known as: CELEBREX       TAKE these medications    carboxymethylcellulose 0.5 % Soln Commonly known as: REFRESH PLUS Place 1 drop into both eyes daily as needed (dry eyes).   clindamycin 300 MG capsule Commonly known as: CLEOCIN Take 900 mg by mouth See admin instructions. Take 900 mg by mouth one hour prior to dental procedures   diclofenac Sodium 1 % Gel Commonly known as: VOLTAREN Apply 2 g topically 2 (two) times daily as needed (knee pain).   Fluocinolone Acetonide 0.01 % Oil Place 5 drops into both ears daily as needed (dry ears).  furosemide 20 MG tablet Commonly known as: LASIX Take 20 mg by mouth daily.   gabapentin 100 MG capsule Commonly known as: NEURONTIN Take 100 mg by mouth at bedtime.   methocarbamol 500 MG tablet Commonly known as: ROBAXIN Take 1 tablet (500 mg total) by mouth every 6 (six) hours as needed for muscle spasms.   multivitamin with minerals Tabs tablet Take 5 tablets by mouth daily.   nebivolol 5 MG tablet Commonly known as: BYSTOLIC TAKE 1/2 TABLET BY MOUTH EVERY DAY   OVER THE COUNTER  MEDICATION Take 1 tablet by mouth daily. Vein Supplement   oxyCODONE 5 MG immediate release tablet Commonly known as: Oxy IR/ROXICODONE Take 1 tablet (5 mg total) by mouth every 4 (four) hours as needed for severe pain.   polyethylene glycol 17 g packet Commonly known as: MIRALAX / GLYCOLAX Take 17 g by mouth 2 (two) times daily.   rivaroxaban 10 MG Tabs tablet Commonly known as: XARELTO Take 1 tablet (10 mg total) by mouth daily with breakfast for 14 days. Then take aspirin 81 mg twice daily for another month - for blood clot prevention   senna 8.6 MG Tabs tablet Commonly known as: SENOKOT Take 1 tablet (8.6 mg total) by mouth at bedtime for 14 days.   sildenafil 20 MG tablet Commonly known as: REVATIO Take 20 mg by mouth daily as needed (ED).   tadalafil 5 MG tablet Commonly known as: CIALIS Take 5 mg by mouth at bedtime.   tamsulosin 0.4 MG Caps capsule Commonly known as: FLOMAX Take 0.4 mg by mouth daily.               Discharge Care Instructions  (From admission, onward)           Start     Ordered   11/17/22 0000  Change dressing       Comments: Maintain surgical dressing until follow up in the clinic. If the edges start to pull up, may reinforce with tape. If the dressing is no longer working, may remove and cover with gauze and tape, but must keep the area dry and clean.  Call with any questions or concerns.   11/17/22 0853            Diagnostic Studies: DG Knee 1-2 Views Right  Result Date: 11/16/2022 CLINICAL DATA:  Elective surgery. Equipment broke during knee surgery. Looking to locate any metal. Dr. Alvan Dame reviewed images in OR. EXAM: RIGHT KNEE - 1-2 VIEW COMPARISON:  None Available. FINDINGS: Images were performed intraoperatively without the presence of a radiologist. Single frontal and 2 lateral views of the right knee. Total right knee arthroplasty hardware is noted. There is overlying metallic apparatus. No small metallic apparent broken  fragments are visualized. Total fluoroscopy images: 3 Total fluoroscopy time: 6 seconds Total dose: Radiation Exposure Index (as provided by the fluoroscopic device): 2.04 mGy air Kerma Please see intraoperative findings for further detail. IMPRESSION: No small apparent broken metallic fragments are visualized. Electronically Signed   By: Yvonne Kendall M.D.   On: 11/16/2022 09:50   DG C-Arm 1-60 Min-No Report  Result Date: 11/16/2022 Fluoroscopy was utilized by the requesting physician.  No radiographic interpretation.    Disposition: Discharge disposition: 01-Home or Self Care       Discharge Instructions     Call MD / Call 911   Complete by: As directed    If you experience chest pain or shortness of breath, CALL 911 and be transported to the  hospital emergency room.  If you develope a fever above 101 F, pus (white drainage) or increased drainage or redness at the wound, or calf pain, call your surgeon's office.   Change dressing   Complete by: As directed    Maintain surgical dressing until follow up in the clinic. If the edges start to pull up, may reinforce with tape. If the dressing is no longer working, may remove and cover with gauze and tape, but must keep the area dry and clean.  Call with any questions or concerns.   Constipation Prevention   Complete by: As directed    Drink plenty of fluids.  Prune juice may be helpful.  You may use a stool softener, such as Colace (over the counter) 100 mg twice a day.  Use MiraLax (over the counter) for constipation as needed.   Diet - low sodium heart healthy   Complete by: As directed    Increase activity slowly as tolerated   Complete by: As directed    Weight bearing as tolerated with assist device (walker, cane, etc) as directed, use it as long as suggested by your surgeon or therapist, typically at least 4-6 weeks.   Post-operative opioid taper instructions:   Complete by: As directed    POST-OPERATIVE OPIOID TAPER  INSTRUCTIONS: It is important to wean off of your opioid medication as soon as possible. If you do not need pain medication after your surgery it is ok to stop day one. Opioids include: Codeine, Hydrocodone(Norco, Vicodin), Oxycodone(Percocet, oxycontin) and hydromorphone amongst others.  Long term and even short term use of opiods can cause: Increased pain response Dependence Constipation Depression Respiratory depression And more.  Withdrawal symptoms can include Flu like symptoms Nausea, vomiting And more Techniques to manage these symptoms Hydrate well Eat regular healthy meals Stay active Use relaxation techniques(deep breathing, meditating, yoga) Do Not substitute Alcohol to help with tapering If you have been on opioids for less than two weeks and do not have pain than it is ok to stop all together.  Plan to wean off of opioids This plan should start within one week post op of your joint replacement. Maintain the same interval or time between taking each dose and first decrease the dose.  Cut the total daily intake of opioids by one tablet each day Next start to increase the time between doses. The last dose that should be eliminated is the evening dose.      TED hose   Complete by: As directed    Use stockings (TED hose) for 2 weeks on both leg(s).  You may remove them at night for sleeping.        Follow-up Information     Paralee Cancel, MD. Schedule an appointment as soon as possible for a visit in 2 week(s).   Specialty: Orthopedic Surgery Contact information: [address] [place] [address] [phone]                  Signed: Irving Copas 11/24/2022, 9:02 AM

## 2022-11-26 DIAGNOSIS — M25661 Stiffness of right knee, not elsewhere classified: Secondary | ICD-10-CM | POA: Diagnosis not present

## 2022-11-26 DIAGNOSIS — M25561 Pain in right knee: Secondary | ICD-10-CM | POA: Diagnosis not present

## 2022-11-29 DIAGNOSIS — M25661 Stiffness of right knee, not elsewhere classified: Secondary | ICD-10-CM | POA: Diagnosis not present

## 2022-11-29 DIAGNOSIS — M25561 Pain in right knee: Secondary | ICD-10-CM | POA: Diagnosis not present

## 2022-12-01 DIAGNOSIS — M25561 Pain in right knee: Secondary | ICD-10-CM | POA: Diagnosis not present

## 2022-12-01 DIAGNOSIS — M25661 Stiffness of right knee, not elsewhere classified: Secondary | ICD-10-CM | POA: Diagnosis not present

## 2022-12-03 DIAGNOSIS — M25561 Pain in right knee: Secondary | ICD-10-CM | POA: Diagnosis not present

## 2022-12-06 DIAGNOSIS — M25561 Pain in right knee: Secondary | ICD-10-CM | POA: Diagnosis not present

## 2022-12-06 DIAGNOSIS — M25661 Stiffness of right knee, not elsewhere classified: Secondary | ICD-10-CM | POA: Diagnosis not present

## 2022-12-08 ENCOUNTER — Ambulatory Visit (INDEPENDENT_AMBULATORY_CARE_PROVIDER_SITE_OTHER): Payer: BC Managed Care – PPO | Admitting: Podiatry

## 2022-12-08 ENCOUNTER — Encounter: Payer: Self-pay | Admitting: Podiatry

## 2022-12-08 DIAGNOSIS — M79675 Pain in left toe(s): Secondary | ICD-10-CM

## 2022-12-08 DIAGNOSIS — B351 Tinea unguium: Secondary | ICD-10-CM

## 2022-12-08 DIAGNOSIS — M79674 Pain in right toe(s): Secondary | ICD-10-CM

## 2022-12-09 DIAGNOSIS — M25661 Stiffness of right knee, not elsewhere classified: Secondary | ICD-10-CM | POA: Diagnosis not present

## 2022-12-09 DIAGNOSIS — M25561 Pain in right knee: Secondary | ICD-10-CM | POA: Diagnosis not present

## 2022-12-09 NOTE — Progress Notes (Signed)
Subjective:   Patient ID: Craig [name], male   DOB: 72 y.o.   MRN: [id number]   HPI Patient presents with elongated nailbeds 1-5 both feet which she has trouble taking care of and they become sore with shoe gear.  Patient does not smoke likes to be active   Review of Systems  All other systems reviewed and are negative.       Objective:  Physical Exam Vitals and nursing note reviewed.  Constitutional:      Appearance: He is well-developed.  Pulmonary:     Effort: Pulmonary effort is normal.  Musculoskeletal:        General: Normal range of motion.  Skin:    General: Skin is warm.  Neurological:     Mental Status: He is alert.     Neurovascular status was found to be intact muscle strength is adequate range of motion adequate with moderate edema in the right foot history of DVT with thick yellow brittle nailbeds 1-5 both feet incurvated and painful     Assessment:  Chronic mycotic nail infection that he cannot take care of 1-5 both feet     Plan:  Debridement painful nailbeds 1-5 both feet no angiogenic bleeding reappoint routine care

## 2022-12-13 DIAGNOSIS — M25561 Pain in right knee: Secondary | ICD-10-CM | POA: Diagnosis not present

## 2022-12-13 DIAGNOSIS — M25662 Stiffness of left knee, not elsewhere classified: Secondary | ICD-10-CM | POA: Diagnosis not present

## 2022-12-16 DIAGNOSIS — M25561 Pain in right knee: Secondary | ICD-10-CM | POA: Diagnosis not present

## 2022-12-16 DIAGNOSIS — M25661 Stiffness of right knee, not elsewhere classified: Secondary | ICD-10-CM | POA: Diagnosis not present

## 2022-12-20 DIAGNOSIS — H524 Presbyopia: Secondary | ICD-10-CM | POA: Diagnosis not present

## 2022-12-20 DIAGNOSIS — H5213 Myopia, bilateral: Secondary | ICD-10-CM | POA: Diagnosis not present

## 2022-12-20 DIAGNOSIS — H52203 Unspecified astigmatism, bilateral: Secondary | ICD-10-CM | POA: Diagnosis not present

## 2022-12-21 DIAGNOSIS — M25561 Pain in right knee: Secondary | ICD-10-CM | POA: Diagnosis not present

## 2022-12-21 DIAGNOSIS — M25661 Stiffness of right knee, not elsewhere classified: Secondary | ICD-10-CM | POA: Diagnosis not present

## 2022-12-23 DIAGNOSIS — M25561 Pain in right knee: Secondary | ICD-10-CM | POA: Diagnosis not present

## 2022-12-29 DIAGNOSIS — M25561 Pain in right knee: Secondary | ICD-10-CM | POA: Diagnosis not present

## 2022-12-29 DIAGNOSIS — M25661 Stiffness of right knee, not elsewhere classified: Secondary | ICD-10-CM | POA: Diagnosis not present

## 2022-12-30 DIAGNOSIS — Z471 Aftercare following joint replacement surgery: Secondary | ICD-10-CM | POA: Diagnosis not present

## 2023-01-03 DIAGNOSIS — M25561 Pain in right knee: Secondary | ICD-10-CM | POA: Diagnosis not present

## 2023-01-03 DIAGNOSIS — M25661 Stiffness of right knee, not elsewhere classified: Secondary | ICD-10-CM | POA: Diagnosis not present

## 2023-01-06 DIAGNOSIS — M25561 Pain in right knee: Secondary | ICD-10-CM | POA: Diagnosis not present

## 2023-01-06 DIAGNOSIS — M25661 Stiffness of right knee, not elsewhere classified: Secondary | ICD-10-CM | POA: Diagnosis not present

## 2023-01-10 DIAGNOSIS — M25561 Pain in right knee: Secondary | ICD-10-CM | POA: Diagnosis not present

## 2023-01-10 DIAGNOSIS — M25661 Stiffness of right knee, not elsewhere classified: Secondary | ICD-10-CM | POA: Diagnosis not present

## 2023-01-13 DIAGNOSIS — M25661 Stiffness of right knee, not elsewhere classified: Secondary | ICD-10-CM | POA: Diagnosis not present

## 2023-01-13 DIAGNOSIS — M25561 Pain in right knee: Secondary | ICD-10-CM | POA: Diagnosis not present

## 2023-01-17 DIAGNOSIS — M25561 Pain in right knee: Secondary | ICD-10-CM | POA: Diagnosis not present

## 2023-01-17 DIAGNOSIS — M25661 Stiffness of right knee, not elsewhere classified: Secondary | ICD-10-CM | POA: Diagnosis not present

## 2023-01-20 DIAGNOSIS — M25561 Pain in right knee: Secondary | ICD-10-CM | POA: Diagnosis not present

## 2023-01-20 DIAGNOSIS — M25661 Stiffness of right knee, not elsewhere classified: Secondary | ICD-10-CM | POA: Diagnosis not present

## 2023-01-24 DIAGNOSIS — M25661 Stiffness of right knee, not elsewhere classified: Secondary | ICD-10-CM | POA: Diagnosis not present

## 2023-01-24 DIAGNOSIS — M25561 Pain in right knee: Secondary | ICD-10-CM | POA: Diagnosis not present

## 2023-01-28 DIAGNOSIS — M25561 Pain in right knee: Secondary | ICD-10-CM | POA: Diagnosis not present

## 2023-01-28 DIAGNOSIS — M25661 Stiffness of right knee, not elsewhere classified: Secondary | ICD-10-CM | POA: Diagnosis not present

## 2023-03-02 ENCOUNTER — Other Ambulatory Visit: Payer: Self-pay | Admitting: Cardiology

## 2023-03-14 DIAGNOSIS — R600 Localized edema: Secondary | ICD-10-CM | POA: Diagnosis not present

## 2023-03-14 DIAGNOSIS — R7303 Prediabetes: Secondary | ICD-10-CM | POA: Diagnosis not present

## 2023-03-14 DIAGNOSIS — Z125 Encounter for screening for malignant neoplasm of prostate: Secondary | ICD-10-CM | POA: Diagnosis not present

## 2023-03-14 DIAGNOSIS — Z0000 Encounter for general adult medical examination without abnormal findings: Secondary | ICD-10-CM | POA: Diagnosis not present

## 2023-03-14 DIAGNOSIS — I10 Essential (primary) hypertension: Secondary | ICD-10-CM | POA: Diagnosis not present

## 2023-03-14 DIAGNOSIS — I519 Heart disease, unspecified: Secondary | ICD-10-CM | POA: Diagnosis not present

## 2023-03-23 DIAGNOSIS — I519 Heart disease, unspecified: Secondary | ICD-10-CM | POA: Diagnosis not present

## 2023-06-14 ENCOUNTER — Other Ambulatory Visit (HOSPITAL_COMMUNITY): Payer: Self-pay | Admitting: Internal Medicine

## 2023-06-14 DIAGNOSIS — I87393 Chronic venous hypertension (idiopathic) with other complications of bilateral lower extremity: Secondary | ICD-10-CM | POA: Diagnosis not present

## 2023-06-14 DIAGNOSIS — M7989 Other specified soft tissue disorders: Secondary | ICD-10-CM | POA: Diagnosis not present

## 2023-06-14 DIAGNOSIS — M79604 Pain in right leg: Secondary | ICD-10-CM

## 2023-06-14 DIAGNOSIS — R600 Localized edema: Secondary | ICD-10-CM | POA: Diagnosis not present

## 2023-06-14 DIAGNOSIS — I8310 Varicose veins of unspecified lower extremity with inflammation: Secondary | ICD-10-CM | POA: Diagnosis not present

## 2023-07-26 DIAGNOSIS — R600 Localized edema: Secondary | ICD-10-CM | POA: Diagnosis not present

## 2023-07-26 DIAGNOSIS — I872 Venous insufficiency (chronic) (peripheral): Secondary | ICD-10-CM | POA: Diagnosis not present

## 2023-07-26 DIAGNOSIS — I890 Lymphedema, not elsewhere classified: Secondary | ICD-10-CM | POA: Diagnosis not present

## 2023-08-15 DIAGNOSIS — M4712 Other spondylosis with myelopathy, cervical region: Secondary | ICD-10-CM | POA: Diagnosis not present

## 2023-08-15 DIAGNOSIS — R29898 Other symptoms and signs involving the musculoskeletal system: Secondary | ICD-10-CM | POA: Diagnosis not present

## 2023-08-15 DIAGNOSIS — M542 Cervicalgia: Secondary | ICD-10-CM | POA: Diagnosis not present

## 2023-08-18 ENCOUNTER — Other Ambulatory Visit: Payer: Self-pay | Admitting: Neurosurgery

## 2023-08-18 DIAGNOSIS — M4712 Other spondylosis with myelopathy, cervical region: Secondary | ICD-10-CM

## 2023-09-02 ENCOUNTER — Encounter: Payer: Self-pay | Admitting: Neurosurgery

## 2023-09-15 ENCOUNTER — Ambulatory Visit
Admission: RE | Admit: 2023-09-15 | Discharge: 2023-09-15 | Disposition: A | Discharge status: Home / Self Care | Payer: BC Managed Care – PPO | Source: Ambulatory Visit | Attending: Neurosurgery | Admitting: Neurosurgery

## 2023-09-15 DIAGNOSIS — M4802 Spinal stenosis, cervical region: Secondary | ICD-10-CM | POA: Diagnosis not present

## 2023-09-15 DIAGNOSIS — Z981 Arthrodesis status: Secondary | ICD-10-CM | POA: Diagnosis not present

## 2023-09-15 DIAGNOSIS — M47812 Spondylosis without myelopathy or radiculopathy, cervical region: Secondary | ICD-10-CM | POA: Diagnosis not present

## 2023-09-15 DIAGNOSIS — M4712 Other spondylosis with myelopathy, cervical region: Secondary | ICD-10-CM

## 2023-09-26 DIAGNOSIS — I10 Essential (primary) hypertension: Secondary | ICD-10-CM | POA: Diagnosis not present

## 2023-09-26 DIAGNOSIS — R7303 Prediabetes: Secondary | ICD-10-CM | POA: Diagnosis not present

## 2023-09-26 DIAGNOSIS — M545 Low back pain, unspecified: Secondary | ICD-10-CM | POA: Diagnosis not present

## 2023-09-26 DIAGNOSIS — N400 Enlarged prostate without lower urinary tract symptoms: Secondary | ICD-10-CM | POA: Diagnosis not present

## 2023-11-03 DIAGNOSIS — N5201 Erectile dysfunction due to arterial insufficiency: Secondary | ICD-10-CM | POA: Diagnosis not present

## 2023-11-03 DIAGNOSIS — R351 Nocturia: Secondary | ICD-10-CM | POA: Diagnosis not present

## 2023-11-03 DIAGNOSIS — N401 Enlarged prostate with lower urinary tract symptoms: Secondary | ICD-10-CM | POA: Diagnosis not present

## 2023-11-21 DIAGNOSIS — M7061 Trochanteric bursitis, right hip: Secondary | ICD-10-CM | POA: Diagnosis not present

## 2023-11-21 DIAGNOSIS — M51369 Other intervertebral disc degeneration, lumbar region without mention of lumbar back pain or lower extremity pain: Secondary | ICD-10-CM | POA: Diagnosis not present

## 2023-11-21 DIAGNOSIS — M4316 Spondylolisthesis, lumbar region: Secondary | ICD-10-CM | POA: Diagnosis not present

## 2023-11-21 DIAGNOSIS — Z96641 Presence of right artificial hip joint: Secondary | ICD-10-CM | POA: Diagnosis not present

## 2023-11-21 DIAGNOSIS — M48062 Spinal stenosis, lumbar region with neurogenic claudication: Secondary | ICD-10-CM | POA: Diagnosis not present

## 2023-12-12 ENCOUNTER — Ambulatory Visit: Payer: BC Managed Care – PPO | Attending: Cardiology | Admitting: Cardiology

## 2023-12-12 ENCOUNTER — Encounter: Payer: Self-pay | Admitting: Cardiology

## 2023-12-12 VITALS — BP 108/76 | HR 56 | Ht 63.5 in | Wt 236.0 lb

## 2023-12-12 DIAGNOSIS — I10 Essential (primary) hypertension: Secondary | ICD-10-CM

## 2023-12-12 DIAGNOSIS — E669 Obesity, unspecified: Secondary | ICD-10-CM | POA: Diagnosis not present

## 2023-12-12 DIAGNOSIS — Z86711 Personal history of pulmonary embolism: Secondary | ICD-10-CM | POA: Diagnosis not present

## 2023-12-12 DIAGNOSIS — E785 Hyperlipidemia, unspecified: Secondary | ICD-10-CM

## 2023-12-12 DIAGNOSIS — R7303 Prediabetes: Secondary | ICD-10-CM

## 2023-12-12 DIAGNOSIS — I2510 Atherosclerotic heart disease of native coronary artery without angina pectoris: Secondary | ICD-10-CM

## 2023-12-12 DIAGNOSIS — Z86718 Personal history of other venous thrombosis and embolism: Secondary | ICD-10-CM

## 2023-12-12 NOTE — Progress Notes (Unsigned)
 Cardiology Office Note:  .   Date:  12/13/2023  ID:  Craig [name], DOB [birth year], MRN [id number] PCP: [name], MD  [facility] Providers Cardiologist:  Bryan Lemma, MD     Chief Complaint  Patient presents with   Follow-up    Routine follow-up    Patient Profile: .     Craig [name] is a morbidly obese 73 y.o. male  with a PMH notable for history of DVT/PE, COPD, HTN who presents here for delayed annual follow-up at the request of [name], MD.  Peripheral Vascular (Mostly Venous) Disease -> history of DVT, HTN, & pre-diabetes, who is followed by Dr. Herbie Baltimore.   Per chart review, in 2010, patient had an abnormal treadmill stress test and he underwent cardiac catheterization on 10/30/08 with Dr. Jacinto Halim.   Cardiac cath => relatively normal In 2014, patient was found to have an acute right DVT and he was treated with Xarelto until 04/2014. Most recent doppler studies in 6/17 showed no evidence of DVT in bilateral lower extremities. Post DVT, patient continued to have right>left lower extremity edema, and continued to [place] lower extremity compression stockings and lasix as needed.  He was seen in office on 10/27/18, during which time he complained of atypical chest pain.  nuclear stress test on 11/01/18 that showed EF 48%, a small defect of mild severity present in the basal inferior location, and a small defect of mild severity present in the apex location. Study was reviewed by Dr. Herbie Baltimore and defects were felt to be diaphragmatic attenuation, and the study was thought to be low risk.     Craig [name] was last seen on November 10, 2022 By Javier Docker doing well with no major issues.  Still driving tractor trailer.  Walking at least 10 minutes and out of the office.  No chest pain or dyspnea.  Able to lift heavy items.  Had a knee surgery scheduled for end of February 2024 => preop clearance provided.  Subjective  Discussed the use of AI scribe software for clinical note  transcription with the patient, who gave verbal consent to proceed.  History of Present Illness   Craig [name] is a 73 year old male with coronary artery disease who presents for a cardiology follow-up.  He has a history of coronary artery disease and is currently taking 2.5 mg of Bystolic daily for blood pressure management. He experiences a slow rhythm with occasional skipped beats on his EKG, consistent with previous findings, but does not have significant symptoms from the arrhythmia.  He experiences swelling, which he attributes to venous disease. The swelling typically decreases overnight but was present this morning as he did not take his pill to avoid frequent bathroom trips. He uses furosemide 20 mg daily for swelling and wears support stockings daily, especially while working and driving. He experiences shortness of breath with exertion, such as walking from his car, but no shortness of breath when lying flat or waking up. He denies any exertional chest pain or rapid/irregular heartbeats (just the occasional skipped beats when HR slow).     He attributes his walking limitations primarily to sciatica, which began about a month and a half ago, affecting his leg and causing swelling. He is not currently on any cholesterol medication. His recent lab results show an LDL of 89 mg/dL, HDL of 54 mg/dL, triglycerides of 77 mg/dL, and total cholesterol of 158 mg/dL. He weighs 230 pounds and acknowledges the need to lose  weight, having previously been active in tennis before knee surgery.  His A1c is 6.1%, indicating a pre-diabetic state. He reports episodes of simultaneous weakness in both shoulders lasting about five minutes, which he suspects may be related to a past neck fusion. An MRI did not reveal any abnormalities, but he notes that the symptoms could be due to nerve impingement or scar tissue from a previous accident. He experiences numbness in his hands during these episodes.        Objective    Medications - Bystolic 2.5 mg daily - Neurontin (gabapentin) 100 mg nightly PRN sleep - Clindamycin PRN for dental procedures (2/2 joint prosthesis) - Furosemide 20 mg daily for swelling - Cialis (tadalafil) 5 mg daily PRN for erectile dysfunction and blood flow - Flomax 0.4 mg daily for urination  Studies Reviewed: Marland Kitchen   EKG Interpretation Date/Time:  Monday December 12 2023 10:50:51 EDT Ventricular Rate:  56 PR Interval:  164 QRS Duration:  136 QT Interval:  454 QTC Calculation: 438 R Axis:   11  Text Interpretation: Sinus bradycardia with Premature atrial complexes Right bundle branch block T wave abnormality, consider inferior ischemia No previous ECGs available Confirmed by Bryan Lemma ([id number]) on 12/12/2023 11:06:18 AM   Cardiac Cath 2010: EF 55%.  Proximal RCA ~20% stenosis with spasm.  LM, LAD and LCx normal  Myoview 11/01/2018: EF estimated 45 to 54%.  Small size mild severity defect in the basal inferior wall.  Nonreversible more consistent with diaphragmatic attenuation over infarction.  Also noted is apical defect is possibly reversible versus artifact.  LOW RISK.  LABS 08/2023: TC 158, TG 77, HDL 54, LDL 89.  A1c 6.1.  Risk Assessment/Calculations:            Physical Exam:   VS:  BP 108/76   Pulse (!) 56   Ht 5' 3.5" (1.613 m)   Wt 236 lb (107 kg)   SpO2 97%   BMI 41.15 kg/m    Wt Readings from Last 3 Encounters:  12/12/23 236 lb (107 kg)  11/16/22 236 lb (107 kg)  11/10/22 236 lb (107 kg)    GENERAL: Alert, cooperative, well developed, no acute distress.  Morbidly obese. HEENT: Normocephalic, normal oropharynx, moist mucous membranes. CHEST: Clear to auscultation bilaterally, no wheezes, rhonchi, or crackles. CARDIOVASCULAR: Normal heart rate and rhythm, S1 and S2 normal without murmurs. ABDOMEN: Soft, non-tender, non-distended, without organomegaly, normal bowel sounds. EXTREMITIES: No cyanosis or edema. NEUROLOGICAL: Cranial nerves grossly intact, moves  all extremities without gross motor or sensory deficit.   ASSESSMENT AND PLAN: .    Problem List Items Addressed This Visit       Cardiology Problems   Coronary artery disease involving native coronary artery of native heart without angina pectoris - Primary (Chronic)   Minimal disease in the past by cath.  Continue to treat risk factors.  Address patient issues well.  Close to control blood pressure and lipids as well as diabetes.  Recommend follow-up with PCP soon.      Relevant Orders   EKG 12-Lead (Completed)   Essential hypertension (Chronic)   Blood pressure is well-controlled at 108/70 mmHg on current medication regimen. - Continue current antihypertensive therapy.  (Bystolic 2.5 mg daily plus Lasix 20 mg daily)      Relevant Orders   EKG 12-Lead (Completed)   Hyperlipidemia with target LDL less than 100   LDL is 89 mg/dL, above the target of 70 mg/dL for moderate coronary artery disease. Current levels  are stable, but dietary changes are recommended to prevent an increase. - Advise dietary modifications to reduce LDL, including using olive oil or avocado oil instead of animal fats and hydrogenated fats.        Other   History of DVT (deep vein thrombosis) (Chronic)   Peripheral Edema likely related to prior DVT resulting in venous insufficiency, decreases overnight, managed with furosemide and support stockings. - Continue furosemide as needed for swelling. - Wear support stockings, especially on the leg affected by sciatica.      History of pulmonary embolus (PE) (Chronic)   No longer on DOAC.      Obesity (BMI 30-39.9) (Chronic)   Stressed importance of working on diet and exercise.  Hopefully losing weight will improve blood sugar levels as well as lipids and blood pressure.      Obesity, morbid (HCC)   Prediabetes (Chronic)   A1c is 6.1, indicating pre-diabetes. Advised to monitor diet to prevent progression to diabetes. - Advise dietary modifications to  reduce intake of starches, sweets, and high-carbohydrate foods.       Sciatica Affects mobility more than shortness of breath or chest pain.  Cervical Radiculopathy Intermittent weakness and numbness in both shoulders and arms, likely due to cervical nerve impingement possibly related to previous neck fusion and scar tissue.  Follow-up Well-managed from a cardiac perspective with no significant issues recently. - Transition care to primary care physician. - Return to cardiologist if new cardiac issues develop.  Recording duration: 15 minutes             Follow-Up: Return if symptoms worsen or fail to improve.    Signed, Marykay Lex, MD, MS Bryan Lemma, M.D., M.S. Interventional Cardiologist  [facility]  Pager # [phone] Phone # [phone] [address] [place], [place] [address]

## 2023-12-12 NOTE — Patient Instructions (Signed)
 Medication Instructions:   No changes     Lab Work: Not needed    Testing/Procedures:  Not needed  Follow-Up: At [facility], you and your health needs are our priority.  As part of our continuing mission to provide you with exceptional heart care, we have created designated Provider Care Teams.  These Care Teams include your primary Cardiologist (physician) and Advanced Practice Providers (APPs -  Physician Assistants and Nurse Practitioners) who all work together to provide you with the care you need, when you need it.     Your next appointment:   As needed   The format for your next appointment:   In Person  Provider:   Bryan Lemma, MD

## 2023-12-13 ENCOUNTER — Encounter: Payer: Self-pay | Admitting: Cardiology

## 2023-12-13 DIAGNOSIS — R7303 Prediabetes: Secondary | ICD-10-CM | POA: Insufficient documentation

## 2023-12-13 DIAGNOSIS — I2510 Atherosclerotic heart disease of native coronary artery without angina pectoris: Secondary | ICD-10-CM | POA: Insufficient documentation

## 2023-12-13 DIAGNOSIS — E785 Hyperlipidemia, unspecified: Secondary | ICD-10-CM | POA: Insufficient documentation

## 2023-12-13 NOTE — Assessment & Plan Note (Signed)
 A1c is 6.1, indicating pre-diabetes. Advised to monitor diet to prevent progression to diabetes. - Advise dietary modifications to reduce intake of starches, sweets, and high-carbohydrate foods.

## 2023-12-13 NOTE — Assessment & Plan Note (Signed)
 LDL is 89 mg/dL, above the target of 70 mg/dL for moderate coronary artery disease. Current levels are stable, but dietary changes are recommended to prevent an increase. - Advise dietary modifications to reduce LDL, including using olive oil or avocado oil instead of animal fats and hydrogenated fats.

## 2023-12-13 NOTE — Assessment & Plan Note (Signed)
 Minimal disease in the past by cath.  Continue to treat risk factors.  Address patient issues well.  Close to control blood pressure and lipids as well as diabetes.  Recommend follow-up with PCP soon.

## 2023-12-13 NOTE — Assessment & Plan Note (Signed)
 Stressed importance of working on diet and exercise.  Hopefully losing weight will improve blood sugar levels as well as lipids and blood pressure.

## 2023-12-13 NOTE — Assessment & Plan Note (Signed)
 Blood pressure is well-controlled at 108/70 mmHg on current medication regimen. - Continue current antihypertensive therapy.  (Bystolic 2.5 mg daily plus Lasix 20 mg daily)

## 2023-12-13 NOTE — Assessment & Plan Note (Signed)
No longer on DOAC.

## 2023-12-13 NOTE — Assessment & Plan Note (Signed)
 Peripheral Edema likely related to prior DVT resulting in venous insufficiency, decreases overnight, managed with furosemide and support stockings. - Continue furosemide as needed for swelling. - Wear support stockings, especially on the leg affected by sciatica.

## 2024-01-04 DIAGNOSIS — M7061 Trochanteric bursitis, right hip: Secondary | ICD-10-CM | POA: Diagnosis not present

## 2024-01-04 DIAGNOSIS — M51369 Other intervertebral disc degeneration, lumbar region without mention of lumbar back pain or lower extremity pain: Secondary | ICD-10-CM | POA: Diagnosis not present

## 2024-01-18 DIAGNOSIS — L819 Disorder of pigmentation, unspecified: Secondary | ICD-10-CM | POA: Diagnosis not present

## 2024-01-18 DIAGNOSIS — I10 Essential (primary) hypertension: Secondary | ICD-10-CM | POA: Diagnosis not present

## 2024-01-18 DIAGNOSIS — R600 Localized edema: Secondary | ICD-10-CM | POA: Diagnosis not present

## 2024-01-18 DIAGNOSIS — I87391 Chronic venous hypertension (idiopathic) with other complications of right lower extremity: Secondary | ICD-10-CM | POA: Diagnosis not present

## 2024-02-22 ENCOUNTER — Other Ambulatory Visit: Payer: Self-pay | Admitting: Cardiology

## 2024-03-12 DIAGNOSIS — K5900 Constipation, unspecified: Secondary | ICD-10-CM | POA: Diagnosis not present

## 2024-03-12 DIAGNOSIS — R5383 Other fatigue: Secondary | ICD-10-CM | POA: Diagnosis not present

## 2024-03-12 DIAGNOSIS — R001 Bradycardia, unspecified: Secondary | ICD-10-CM | POA: Diagnosis not present

## 2024-03-12 DIAGNOSIS — R0600 Dyspnea, unspecified: Secondary | ICD-10-CM | POA: Diagnosis not present

## 2024-03-13 ENCOUNTER — Ambulatory Visit (HOSPITAL_COMMUNITY)
Admission: RE | Admit: 2024-03-13 | Discharge: 2024-03-13 | Disposition: A | Discharge status: Home / Self Care | Source: Ambulatory Visit | Attending: Internal Medicine | Admitting: Internal Medicine

## 2024-03-13 ENCOUNTER — Other Ambulatory Visit (HOSPITAL_COMMUNITY): Payer: Self-pay | Admitting: Internal Medicine

## 2024-03-13 ENCOUNTER — Ambulatory Visit (HOSPITAL_COMMUNITY)
Admission: RE | Admit: 2024-03-13 | Discharge: 2024-03-13 | Disposition: A | Discharge status: Home / Self Care | Source: Ambulatory Visit | Attending: Vascular Surgery | Admitting: Vascular Surgery

## 2024-03-13 DIAGNOSIS — R0600 Dyspnea, unspecified: Secondary | ICD-10-CM | POA: Diagnosis not present

## 2024-03-13 DIAGNOSIS — N281 Cyst of kidney, acquired: Secondary | ICD-10-CM | POA: Diagnosis not present

## 2024-03-13 DIAGNOSIS — R600 Localized edema: Secondary | ICD-10-CM

## 2024-03-13 MED ORDER — IOHEXOL 350 MG/ML SOLN
75.0000 mL | Freq: Once | INTRAVENOUS | Status: AC | PRN
Start: 2024-03-13 — End: 2024-03-13
  Administered 2024-03-13: 75 mL via INTRAVENOUS

## 2024-03-16 DIAGNOSIS — R001 Bradycardia, unspecified: Secondary | ICD-10-CM | POA: Diagnosis not present

## 2024-03-16 DIAGNOSIS — I82401 Acute embolism and thrombosis of unspecified deep veins of right lower extremity: Secondary | ICD-10-CM | POA: Diagnosis not present

## 2024-04-13 ENCOUNTER — Ambulatory Visit (INDEPENDENT_AMBULATORY_CARE_PROVIDER_SITE_OTHER): Admitting: Podiatry

## 2024-04-13 ENCOUNTER — Encounter: Payer: Self-pay | Admitting: Podiatry

## 2024-04-13 DIAGNOSIS — M79675 Pain in left toe(s): Secondary | ICD-10-CM | POA: Diagnosis not present

## 2024-04-13 DIAGNOSIS — M79674 Pain in right toe(s): Secondary | ICD-10-CM | POA: Diagnosis not present

## 2024-04-13 DIAGNOSIS — B351 Tinea unguium: Secondary | ICD-10-CM

## 2024-04-15 NOTE — Progress Notes (Signed)
 Subjective:   Patient ID: Craig [name], male   DOB: 73 y.o.   MRN: [id number]   HPI Patient presents significant elongation of nailbeds 1-5 both feet that are thick and impossible to cut and it has been over a year   ROS      Objective:  Physical Exam  Neurovascular status intact thick yellow brittle nailbeds 1-5 both feet painful     Assessment:  Chronic mycotic nail infection with pain 1-5 both feet     Plan:  Debridement nailbeds 1-5 both feet no iatrogenic bleeding reappoint routine care

## 2024-05-10 DIAGNOSIS — Z13220 Encounter for screening for lipoid disorders: Secondary | ICD-10-CM | POA: Diagnosis not present

## 2024-05-10 DIAGNOSIS — R7303 Prediabetes: Secondary | ICD-10-CM | POA: Diagnosis not present

## 2024-05-10 DIAGNOSIS — R361 Hematospermia: Secondary | ICD-10-CM | POA: Diagnosis not present

## 2024-05-10 DIAGNOSIS — I10 Essential (primary) hypertension: Secondary | ICD-10-CM | POA: Diagnosis not present

## 2024-05-10 DIAGNOSIS — I82409 Acute embolism and thrombosis of unspecified deep veins of unspecified lower extremity: Secondary | ICD-10-CM | POA: Diagnosis not present

## 2024-05-10 DIAGNOSIS — Z0000 Encounter for general adult medical examination without abnormal findings: Secondary | ICD-10-CM | POA: Diagnosis not present

## 2024-05-10 DIAGNOSIS — Z23 Encounter for immunization: Secondary | ICD-10-CM | POA: Diagnosis not present

## 2024-06-19 DIAGNOSIS — M1812 Unilateral primary osteoarthritis of first carpometacarpal joint, left hand: Secondary | ICD-10-CM | POA: Diagnosis not present

## 2024-06-19 DIAGNOSIS — M25532 Pain in left wrist: Secondary | ICD-10-CM | POA: Diagnosis not present

## 2024-06-19 DIAGNOSIS — M180 Bilateral primary osteoarthritis of first carpometacarpal joints: Secondary | ICD-10-CM | POA: Diagnosis not present

## 2024-06-19 DIAGNOSIS — M25531 Pain in right wrist: Secondary | ICD-10-CM | POA: Diagnosis not present

## 2024-07-10 DIAGNOSIS — I82511 Chronic embolism and thrombosis of right femoral vein: Secondary | ICD-10-CM | POA: Diagnosis not present

## 2024-07-10 DIAGNOSIS — I82531 Chronic embolism and thrombosis of right popliteal vein: Secondary | ICD-10-CM | POA: Diagnosis not present

## 2024-07-10 DIAGNOSIS — I872 Venous insufficiency (chronic) (peripheral): Secondary | ICD-10-CM | POA: Diagnosis not present

## 2024-07-10 DIAGNOSIS — M7989 Other specified soft tissue disorders: Secondary | ICD-10-CM | POA: Diagnosis not present

## 2024-07-25 DIAGNOSIS — Z96653 Presence of artificial knee joint, bilateral: Secondary | ICD-10-CM | POA: Diagnosis not present

## 2024-07-25 DIAGNOSIS — M25561 Pain in right knee: Secondary | ICD-10-CM | POA: Diagnosis not present

## 2024-07-25 DIAGNOSIS — M7061 Trochanteric bursitis, right hip: Secondary | ICD-10-CM | POA: Diagnosis not present

## 2024-07-25 DIAGNOSIS — M25562 Pain in left knee: Secondary | ICD-10-CM | POA: Diagnosis not present

## 2024-08-14 NOTE — Progress Notes (Addendum)
 [facility]  [phone]   HEMATOLOGY ONCOLOGY CONSULTATION   Craig [name]  DOB: 11-11-1950  MR#: [id number]  CSN#: [id number]    Requesting Physician:   Patient Care Team: Rexanne Ingle, MD as PCP - General (Internal Medicine) Anner Alm ORN, MD as PCP - Cardiology (Cardiology) Mavis Purchase, MD as Consulting Physician (Neurosurgery)  Reason for consult: DVT  History of present illness:   Patient has medical history of hypertension, peripheral vascular disease, osteoarthritis, DVT/PE.  He presented to his primary care provider in June 2025 with complaints of shortness of breath with exertion and right leg swelling and pain.  He reported to his primary care that what he was feeling was very similar to how he felt during previous episode with PE/DVT about 10 years ago.  He also reported decreased energy.  During that visit, an EKG was done showing bradycardia, but otherwise unremarkable.  D-dimer was elevated to 3.11.  He was sent for stat CTA chest which was negative for PE.  He also had lower extremity Doppler which showed DVT in the right popliteal femoral vein.  He was started on Xarelto  per vascular restoration center.  He was then referred to hematology for possible hypercoagulable workup.  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  He does try to stop and stretch his legs.  Most of his routes are 4 hours each direction.  He does wear compression socks while he is working. Today, the patient has persistent pitting edema in bilateral legs, worse on the right side.  He does typically take a fluid pill but did not this morning due to urinary frequency that it causes.  He denies chest pain, chest pressure, or shortness of breath.  He denies palpitations.  His blood pressure is generally well-managed.  He denies new headaches or visual disturbance.  He states he is tolerating Xarelto  well.  Denies new easy bleeding or bruising.   Denies hematemesis or medic easier.  He denies fevers, chills, body aches, night sweats, or unintentional weight loss.  He reports his age-appropriate cancer screenings are up-to-date.  He routinely sees his primary care provider who checks PSA.  States his colonoscopy was last done by Dr. Dianna.  Socially, the patient is married, living here in [place], [place].  He is a nurse, mental health.  Routes are typically 4 hours each way.  He is hoping to retire in the next 1 to 2 years.  He denies smoking.  He does not use alcohol .  He does not take medications or drugs that are prescribed by a provider.  MEDICAL HISTORY:  Past Medical History:  Diagnosis Date   Complication of anesthesia    shortness of breath with colonoscopy but not with most recent one   DVT (deep venous thrombosis) (HCC)    Hypertension    Hypogonadism male    OA (osteoarthritis) of hip    s/p R THR   Osteoarthritis of both knees    Peripheral vascular disease    Pre-diabetes     SURGICAL HISTORY: Past Surgical History:  Procedure Laterality Date   CARDIAC CATHETERIZATION  2006   no blockage   CARDIAC CATHETERIZATION  10/30/2008   CARPAL TUNNEL RELEASE  2009   Left   HEMORRHOIDECTOMY WITH HEMORRHOID BANDING     KNEE ARTHROSCOPY     NECK SURGERY     TOTAL HIP [facility]ROPLASTY Right [facility]  TOTAL KNEE ARTHROPLASTY Left 06/25/2021   Procedure: TOTAL KNEE ARTHROPLASTY;  Surgeon: Ernie Cough, MD;  Location: [facility];  Service: Orthopedics;  Laterality: Left;   TOTAL KNEE ARTHROPLASTY Right 11/16/2022   Procedure: TOTAL KNEE ARTHROPLASTY;  Surgeon: Ernie Cough, MD;  Location: [facility];  Service: Orthopedics;  Laterality: Right;   Venous Doppler, lower extremity Right 05/18/2013   Chronic appearing DVT with some recanalization involving the femoral vein, posterior tibial and peroneal veins. There does appear to be partial resolution of the common femoral vein and popliteal pain thrombus  with residual findings distally.    SOCIAL HISTORY: Social History   Socioeconomic History   Marital status: Married    Spouse name: Not on file   Number of children: Not on file   Years of education: Not on file   Highest education level: Not on file  Occupational History   Not on file  Tobacco Use   Smoking status: Never   Smokeless tobacco: Never  Vaping Use   Vaping status: Never Used  Substance and Sexual Activity   Alcohol  use: No   Drug use: No   Sexual activity: Not on file  Other Topics Concern   Not on file  Social History Narrative   He is married truck driver. He plays tennis avidly, but is really been bothered by his knees recently. For that reason has not been out exercising much. He was really to work on his weight loss. Unfortunately of the PE his knees really slowed her progress. He does not drink alcohol  and does not smoke.   Social Drivers of Corporate Investment Banker Strain: Not on file  Food Insecurity: No Food Insecurity (08/14/2024)   Hunger Vital Sign    Worried About Running Out of Food in the Last Year: Never true    Ran Out of Food in the Last Year: Never true  Transportation Needs: No Transportation Needs (08/14/2024)   PRAPARE - Administrator, Civil Service (Medical): No    Lack of Transportation (Non-Medical): No  Physical Activity: Not on file  Stress: Not on file  Social Connections: Not on file  Intimate Partner Violence: Not At Risk (11/16/2022)   Humiliation, Afraid, Rape, and Kick questionnaire    Fear of Current or Ex-Partner: No    Emotionally Abused: No    Physically Abused: No    Sexually Abused: No    FAMILY HISTORY: Family History  Problem Relation Age of Onset   Cancer Mother    Diabetes Father    Stroke Maternal Grandmother    Diabetes Paternal Grandmother    Diabetes Sister     ALLERGIES:  is allergic to penicillins.  MEDICATIONS:  Current Outpatient Medications  Medication Sig Dispense Refill    carboxymethylcellulose (REFRESH PLUS) 0.5 % SOLN Place 1 drop into both eyes daily as needed (dry eyes).     clindamycin (CLEOCIN) 300 MG capsule Take 900 mg by mouth See admin instructions. Take 900 mg by mouth one hour prior to dental procedures     Fluocinolone Acetonide 0.01 % OIL Place 5 drops into both ears daily as needed (dry ears).     gabapentin  (NEURONTIN ) 100 MG capsule Take 100 mg by mouth at bedtime.     Multiple Vitamin (MULTIVITAMIN WITH MINERALS) TABS Take 5 tablets by mouth daily.     nebivolol  (BYSTOLIC ) 5 MG tablet Take 0.5 tablets (2.5 mg total) by mouth daily. 45 tablet 3   OVER THE COUNTER MEDICATION Take  1 tablet by mouth daily. Vein Supplement     sildenafil (REVATIO) 20 MG tablet Take 20 mg by mouth daily as needed (ED).     tadalafil  (CIALIS ) 5 MG tablet Take 5 mg by mouth at bedtime.     tamsulosin  (FLOMAX ) 0.4 MG CAPS capsule Take 0.4 mg by mouth daily.     XARELTO  20 MG TABS tablet Take 20 mg by mouth daily. with food     diclofenac Sodium (VOLTAREN) 1 % GEL Apply 2 g topically 2 (two) times daily as needed (knee pain).     furosemide  (LASIX ) 20 MG tablet Take 20 mg by mouth daily.     No current facility-administered medications for this visit.    REVIEW OF SYSTEMS:   Constitutional: Denies fevers, chills or abnormal night sweats Eyes: Denies blurriness of vision, double vision or watery eyes Ears, nose, mouth, throat, and face: Denies mucositis or sore throat Respiratory: Denies cough, dyspnea or wheezes Cardiovascular: Denies palpitations or chest pain.  He does have mild pitting edema in both legs, worse on right side than left.  He continues to have some tenderness in the right lower extremity. Gastrointestinal:  Denies nausea, heartburn or change in bowel habits Skin: Denies abnormal skin rashes Lymphatics: Denies new lymphadenopathy or easy bruising Neurological:Denies numbness, tingling or new weaknesses Behavioral/Psych: Mood is stable, no new changes   All other systems were reviewed with the patient and are negative.  PHYSICAL EXAMINATION: ECOG PERFORMANCE STATUS: 1 - Symptomatic but completely ambulatory  Vitals:   08/15/24 0914  BP: 118/72  Pulse: (!) 49  Resp: 17  Temp: (!) 96.4 F (35.8 C)  SpO2: 99%   Filed Weights   08/15/24 0914  Weight: 230 lb 14.4 oz (104.7 kg)    GENERAL:alert, no distress and comfortable SKIN: skin color, texture, turgor are normal, no rashes or significant lesions EYES: normal, conjunctiva are pink and non-injected, sclera clear OROPHARYNX:no exudate, no erythema and lips, buccal mucosa, and tongue normal  NECK: supple, thyroid  normal size, non-tender, without nodularity LYMPH:  no palpable lymphadenopathy in the cervical, axillary or inguinal LUNGS: clear to auscultation and percussion with normal breathing effort HEART: regular rate & rhythm and no murmurs.  He has bilateral lower extremity edema.  This is 1+ on the right side.  Mild on the left.  There is slight tenderness with palpation of the medial aspect of right lower extremity.  Pedal pulses are palpable and equal, bilaterally. ABDOMEN:abdomen soft, non-tender and normal bowel sounds Musculoskeletal:no cyanosis of digits and no clubbing  PSYCH: alert & oriented x 3 with fluent speech NEURO: no focal motor/sensory deficits  LABORATORY DATA:  I have reviewed the data as listed Lab Results  Component Value Date   WBC 8.5 11/17/2022   HGB 13.2 11/17/2022   HCT 40.1 11/17/2022   MCV 89.9 11/17/2022   PLT 208 11/17/2022    Assessment and Plan Acute deep vein thrombosis (DVT) of femoral vein of right lower extremity (HCC) Assessment & Plan: Patient has medical history of hypertension, peripheral vascular disease, osteoarthritis, DVT/PE.  He presented to his primary care provider in June 2025 with complaints of shortness of breath with exertion and right leg swelling and pain.  He reported to his primary care that what he was feeling  was very similar to how he felt during previous episode with PE/DVT about 10 years ago.  He also reported decreased energy.  During that visit, an EKG was done showing bradycardia, but otherwise unremarkable.  D-dimer was elevated to 3.11.  He was sent for stat CTA chest which was negative for PE.  He also had lower extremity Doppler which showed DVT in the right popliteal femoral vein.  He was started on Xarelto  per vascular restoration center.  He was then referred to hematology for possible hypercoagulable workup.  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  He does try to stop and stretch his legs.  Most of his routes are 4 hours each direction.  He does wear compression socks while he is working. We discussed the difference between provoked and unprovoked DVT.  We believe his clot is likely to be provoked.  His profession is mostly sedentary, sitting for long periods of time, even with stretching his legs every 2 hours.  Since this is patient's second experience with DVT, it is recommended he remain on Xarelto  or anticoagulant for the remainder of his life.  We can do genetic testing to evaluate for hyper coagulable processes however, it would not change his treatment plan.  Recommend he stay on Xarelto  as prescribed.  This can be managed by his primary care provider.  We advised him to report any bleeding, bruising, or dark stools while on this medication.  Thus far, he has tolerated well.  He understands he should contact his primary care or hematology clinic for any unusual signs of bruising, or new symptoms of worsening blood clot.  Though incidence is very rare, patient is may still develop clots on anticoagulants.  At that point treatment would need to be adjusted.  He should return to hematology clinic as needed.      This was a shared visit with Dr. Lanny. All questions were answered. The patient knows to call the clinic with any problems, questions or  concerns.      Craig FORBES Lessen, NP 08/17/2024 9:11 AM  Addendum I have seen the patient, examined him. I agree with the assessment and and plan and have edited the notes.   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 His first episode was 10 years ago, and second one was in June 2025. Both were unprovoked except his risk of sedentary life style due to his job as a naval architect. Due to the recurrent DVT, I recommend life long anticoagulation. I do not think hypercoagulopathy work up will change my recommendation and will hold it at this point. All questions were answered and he agrees with the plan. We will see him as needed.  Craig Lanny MD 08/15/2024

## 2024-08-15 ENCOUNTER — Inpatient Hospital Stay: Attending: Nurse Practitioner | Admitting: Nurse Practitioner

## 2024-08-15 ENCOUNTER — Inpatient Hospital Stay

## 2024-08-15 VITALS — BP 118/72 | HR 49 | Temp 96.4°F | Resp 17 | Wt 230.9 lb

## 2024-08-15 DIAGNOSIS — I82411 Acute embolism and thrombosis of right femoral vein: Secondary | ICD-10-CM

## 2024-08-17 NOTE — Assessment & Plan Note (Signed)
 Patient has medical history of hypertension, peripheral vascular disease, osteoarthritis, DVT/PE.  He presented to his primary care provider in June 2025 with complaints of shortness of breath with exertion and right leg swelling and pain.  He reported to his primary care that what he was feeling was very similar to how he felt during previous episode with PE/DVT about 10 years ago.  He also reported decreased energy.  During that visit, an EKG was done showing bradycardia, but otherwise unremarkable.  D-dimer was elevated to 3.11.  He was sent for stat CTA chest which was negative for PE.  He also had lower extremity Doppler which showed DVT in the right popliteal femoral vein.  He was started on Xarelto  per vascular restoration center.  He was then referred to hematology for possible hypercoagulable workup.  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  He does try to stop and stretch his legs.  Most of his routes are 4 hours each direction.  He does wear compression socks while he is working. We discussed the difference between provoked and unprovoked DVT.  We believe his clot is likely to be provoked.  His profession is mostly sedentary, sitting for long periods of time, even with stretching his legs every 2 hours.  Since this is patient's second experience with DVT, it is recommended he remain on Xarelto  or anticoagulant for the remainder of his life.  We can do genetic testing to evaluate for hyper coagulable processes however, it would not change his treatment plan.  Recommend he stay on Xarelto  as prescribed.  This can be managed by his primary care provider.  We advised him to report any bleeding, bruising, or dark stools while on this medication.  Thus far, he has tolerated well.  He understands he should contact his primary care or hematology clinic for any unusual signs of bruising, or new symptoms of worsening blood clot.  Though incidence is very rare, patient is may  still develop clots on anticoagulants.  At that point treatment would need to be adjusted.  He should return to hematology clinic as needed.

## 2024-09-02 ENCOUNTER — Encounter (HOSPITAL_COMMUNITY): Payer: Self-pay

## 2024-09-02 ENCOUNTER — Emergency Department (HOSPITAL_COMMUNITY)
Admission: EM | Admit: 2024-09-02 | Discharge: 2024-09-02 | Disposition: A | Discharge status: Home / Self Care | Attending: Emergency Medicine | Admitting: Emergency Medicine

## 2024-09-02 DIAGNOSIS — M79661 Pain in right lower leg: Secondary | ICD-10-CM

## 2024-09-02 NOTE — ED Provider Notes (Signed)
 [place] EMERGENCY DEPARTMENT AT [facility] Provider Note   CSN: [id number] Arrival date & time: 09/02/24  8044     Patient presents with: Leg Pain   Craig [name] is a 73 y.o. male.    Leg Pain    Patient has a history of hypertension DVT osteoarthritis.  Patient is currently taking Xarelto .  He noticed he started having some soreness in his calf a couple days ago.  He is not sure if he may have pulled a muscle but he was concerned about a recurrent DVT.  He does continue to take his Xarelto .  He is not having any fevers or chills.  No chest pain or shortness of breath.  Prior to Admission medications   Medication Sig Start Date End Date Taking? Authorizing Provider  carboxymethylcellulose (REFRESH PLUS) 0.5 % SOLN Place 1 drop into both eyes daily as needed (dry eyes).    [provider]  clindamycin (CLEOCIN) 300 MG capsule Take 900 mg by mouth See admin instructions. Take 900 mg by mouth one hour prior to dental procedures 10/02/20   [provider]  diclofenac Sodium (VOLTAREN) 1 % GEL Apply 2 g topically 2 (two) times daily as needed (knee pain).    [provider]  Fluocinolone Acetonide 0.01 % OIL Place 5 drops into both ears daily as needed (dry ears). 09/13/22   [provider]  furosemide  (LASIX ) 20 MG tablet Take 20 mg by mouth daily. 06/08/21   [provider]  gabapentin  (NEURONTIN ) 100 MG capsule Take 100 mg by mouth at bedtime. 10/03/20   [provider]  Multiple Vitamin (MULTIVITAMIN WITH MINERALS) TABS Take 5 tablets by mouth daily.    [provider]  nebivolol  (BYSTOLIC ) 5 MG tablet Take 0.5 tablets (2.5 mg total) by mouth daily. 02/23/24   Anner Alm ORN, MD  OVER THE COUNTER MEDICATION Take 1 tablet by mouth daily. Vein Supplement    [provider]  sildenafil (REVATIO) 20 MG tablet Take 20 mg by mouth daily as needed (ED).    [provider]  tadalafil  (CIALIS ) 5 MG tablet  Take 5 mg by mouth at bedtime. 10/02/20   [provider]  tamsulosin  (FLOMAX ) 0.4 MG CAPS capsule Take 0.4 mg by mouth daily.    [provider]  XARELTO  20 MG TABS tablet Take 20 mg by mouth daily. with food 04/04/24   [provider]    Allergies: Penicillins    Review of Systems  Updated Vital Signs BP (!) 147/68 (BP Location: Left Arm)   Pulse (!) 51   Temp 97.9 F (36.6 C) (Oral)   Resp 16   Ht 1.626 m (5' 4)   Wt 104.3 kg   SpO2 99%   BMI 39.48 kg/m   Physical Exam Vitals and nursing note reviewed.  Constitutional:      General: He is not in acute distress.    Appearance: He is well-developed.  HENT:     Head: Normocephalic and atraumatic.     Right Ear: External ear normal.     Left Ear: External ear normal.  Eyes:     General: No scleral icterus.       Right eye: No discharge.        Left eye: No discharge.     Conjunctiva/sclera: Conjunctivae normal.  Neck:     Trachea: No tracheal deviation.  Cardiovascular:     Rate and Rhythm: Normal rate.  Pulmonary:  Effort: Pulmonary effort is normal. No respiratory distress.     Breath sounds: No stridor.  Abdominal:     General: There is no distension.  Musculoskeletal:        General: Tenderness present. No swelling or deformity.     Cervical back: Neck supple.     Comments: Mild edema noted bilateral lower extremities, patient does wear compression stockings, no erythema, normal pulses bilaterally, patient does have mild tenderness palpation right and left calf  Skin:    General: Skin is warm and dry.     Findings: No rash.  Neurological:     Mental Status: He is alert. Mental status is at baseline.     Cranial Nerves: No dysarthria or facial asymmetry.     Motor: No seizure activity.     (all labs ordered are listed, but only abnormal results are displayed) Labs Reviewed - No data to display  EKG: None  Radiology: No results found.   Procedures   Medications Ordered  in the ED - No data to display                                  Medical Decision Making  Patient complains of calf discomfort.  He is concerned about recurrent DVT.  Patient does not have any findings to suggest infection.  Recurrent DVT is a possibility.  Patient is on anticoagulants.  Unfortunately at this time of night Doppler study is not available.  He is not having any chest pain or shortness of breath.  I think he is safe to continue his oral home medications and we will have him follow-up tomorrow for an outpatient Doppler study     Final diagnoses:  Right calf pain    ED Discharge Orders          Ordered    LE Venous       Comments: IMPORTANT PATIENT INSTRUCTIONS: You have been scheduled for an Outpatient Vascular Study at [facility].  If tomorrow is a Saturday, Sunday or holiday, please go to the [facility] Emergency Department Registration Desk at 11 am tomorrow morning and tell them you are there for a vascular study.  If tomorrow is a weekday (Monday-Friday), please go to the [facility] (address [address] [place]) at 8 am and report to the 4th floor registration Zone A.  Inform registration that you are there for a vascular study.   09/02/24 2018               Randol Simmonds, MD 09/02/24 2020

## 2024-09-02 NOTE — Discharge Instructions (Signed)
 Follow-up tomorrow for a vascular ultrasound test as instructed.  I ordered bilateral Doppler studies.  Continue your anticoagulant medications.  Return to the ED for trouble with chest pain or shortness of breath.

## 2024-09-02 NOTE — ED Triage Notes (Signed)
 Patient arrived with complaints of soreness after a cramp in his right calf a few days ago, concerned due to hx of DVT in same leg earlier this year. Taking 20mg  Xarelto .

## 2024-09-03 ENCOUNTER — Ambulatory Visit (HOSPITAL_COMMUNITY)
Admission: RE | Admit: 2024-09-03 | Discharge: 2024-09-03 | Disposition: A | Source: Ambulatory Visit | Attending: Emergency Medicine | Admitting: Emergency Medicine

## 2024-09-03 DIAGNOSIS — M79661 Pain in right lower leg: Secondary | ICD-10-CM
